# Patient Record
Sex: Female | Born: 1975 | Race: White | Hispanic: No | Marital: Married | Smoking: Former smoker
Health system: Southern US, Community
[De-identification: ages and names within clinical notes are randomized; demographics above are authoritative.]

## PROBLEM LIST (undated history)

## (undated) ENCOUNTER — Inpatient Hospital Stay (HOSPITAL_COMMUNITY): Payer: Self-pay

## (undated) DIAGNOSIS — K589 Irritable bowel syndrome without diarrhea: Secondary | ICD-10-CM

## (undated) DIAGNOSIS — E785 Hyperlipidemia, unspecified: Secondary | ICD-10-CM

## (undated) DIAGNOSIS — T7840XA Allergy, unspecified, initial encounter: Secondary | ICD-10-CM

## (undated) DIAGNOSIS — Z789 Other specified health status: Secondary | ICD-10-CM

## (undated) DIAGNOSIS — Z8619 Personal history of other infectious and parasitic diseases: Secondary | ICD-10-CM

## (undated) DIAGNOSIS — F329 Major depressive disorder, single episode, unspecified: Secondary | ICD-10-CM

## (undated) DIAGNOSIS — K219 Gastro-esophageal reflux disease without esophagitis: Secondary | ICD-10-CM

## (undated) DIAGNOSIS — O09529 Supervision of elderly multigravida, unspecified trimester: Secondary | ICD-10-CM

## (undated) DIAGNOSIS — D649 Anemia, unspecified: Secondary | ICD-10-CM

## (undated) DIAGNOSIS — F32A Depression, unspecified: Secondary | ICD-10-CM

## (undated) HISTORY — PX: COLONOSCOPY: SHX174

## (undated) HISTORY — DX: Allergy, unspecified, initial encounter: T78.40XA

## (undated) HISTORY — DX: Hyperlipidemia, unspecified: E78.5

## (undated) HISTORY — DX: Gastro-esophageal reflux disease without esophagitis: K21.9

## (undated) HISTORY — DX: Irritable bowel syndrome, unspecified: K58.9

## (undated) HISTORY — PX: DILATION AND CURETTAGE OF UTERUS: SHX78

## (undated) HISTORY — DX: Supervision of elderly multigravida, unspecified trimester: O09.529

## (undated) HISTORY — PX: POLYPECTOMY: SHX149

## (undated) HISTORY — DX: Personal history of other infectious and parasitic diseases: Z86.19

## (undated) HISTORY — DX: Depression, unspecified: F32.A

## (undated) HISTORY — PX: WISDOM TOOTH EXTRACTION: SHX21

## (undated) HISTORY — DX: Anemia, unspecified: D64.9

## (undated) HISTORY — DX: Major depressive disorder, single episode, unspecified: F32.9

---

## 2001-09-21 HISTORY — PX: PILONIDAL CYST EXCISION: SHX744

## 2001-12-05 ENCOUNTER — Other Ambulatory Visit: Admission: RE | Admit: 2001-12-05 | Discharge: 2001-12-05 | Payer: Self-pay | Admitting: Obstetrics and Gynecology

## 2001-12-05 ENCOUNTER — Other Ambulatory Visit: Admission: RE | Admit: 2001-12-05 | Discharge: 2001-12-05 | Payer: Self-pay | Admitting: *Deleted

## 2002-12-18 ENCOUNTER — Other Ambulatory Visit: Admission: RE | Admit: 2002-12-18 | Discharge: 2002-12-18 | Payer: Self-pay | Admitting: *Deleted

## 2004-01-02 ENCOUNTER — Other Ambulatory Visit: Admission: RE | Admit: 2004-01-02 | Discharge: 2004-01-02 | Payer: Self-pay | Admitting: *Deleted

## 2008-10-19 ENCOUNTER — Ambulatory Visit (HOSPITAL_COMMUNITY): Admission: RE | Admit: 2008-10-19 | Discharge: 2008-10-19 | Payer: Self-pay | Admitting: Obstetrics & Gynecology

## 2008-10-19 ENCOUNTER — Encounter (INDEPENDENT_AMBULATORY_CARE_PROVIDER_SITE_OTHER): Payer: Self-pay | Admitting: Obstetrics & Gynecology

## 2009-01-11 ENCOUNTER — Ambulatory Visit: Payer: Self-pay | Admitting: Internal Medicine

## 2009-03-26 ENCOUNTER — Ambulatory Visit: Payer: Self-pay | Admitting: Internal Medicine

## 2009-05-10 ENCOUNTER — Encounter (INDEPENDENT_AMBULATORY_CARE_PROVIDER_SITE_OTHER): Payer: Self-pay | Admitting: Obstetrics & Gynecology

## 2009-05-10 ENCOUNTER — Ambulatory Visit (HOSPITAL_COMMUNITY): Admission: RE | Admit: 2009-05-10 | Discharge: 2009-05-10 | Payer: Self-pay | Admitting: Obstetrics & Gynecology

## 2009-11-07 ENCOUNTER — Ambulatory Visit: Payer: Self-pay | Admitting: Internal Medicine

## 2010-04-23 ENCOUNTER — Ambulatory Visit (HOSPITAL_COMMUNITY): Admission: RE | Admit: 2010-04-23 | Discharge: 2010-04-23 | Payer: Self-pay | Admitting: Obstetrics and Gynecology

## 2010-08-19 ENCOUNTER — Inpatient Hospital Stay (HOSPITAL_COMMUNITY)
Admission: AD | Admit: 2010-08-19 | Discharge: 2010-08-19 | Payer: Self-pay | Source: Home / Self Care | Admitting: Obstetrics and Gynecology

## 2010-08-24 ENCOUNTER — Inpatient Hospital Stay (HOSPITAL_COMMUNITY)
Admission: AD | Admit: 2010-08-24 | Discharge: 2010-08-24 | Payer: Self-pay | Source: Home / Self Care | Admitting: Obstetrics & Gynecology

## 2010-08-26 ENCOUNTER — Inpatient Hospital Stay (HOSPITAL_COMMUNITY)
Admission: AD | Admit: 2010-08-26 | Discharge: 2010-08-28 | Payer: Self-pay | Source: Home / Self Care | Attending: Obstetrics and Gynecology | Admitting: Obstetrics and Gynecology

## 2010-12-02 LAB — CBC
HCT: 30.6 % — ABNORMAL LOW (ref 36.0–46.0)
Hemoglobin: 10.7 g/dL — ABNORMAL LOW (ref 12.0–15.0)
Platelets: 199 10*3/uL (ref 150–400)
RBC: 3.16 MIL/uL — ABNORMAL LOW (ref 3.87–5.11)
RBC: 4.02 MIL/uL (ref 3.87–5.11)
RDW: 13.2 % (ref 11.5–15.5)

## 2010-12-27 LAB — CBC
Hemoglobin: 13.2 g/dL (ref 12.0–15.0)
MCHC: 34.3 g/dL (ref 30.0–36.0)
MCV: 95.6 fL (ref 78.0–100.0)
RBC: 4.03 MIL/uL (ref 3.87–5.11)
RDW: 12.6 % (ref 11.5–15.5)

## 2010-12-27 LAB — ABO/RH: ABO/RH(D): A POS

## 2011-01-01 ENCOUNTER — Other Ambulatory Visit: Payer: Self-pay | Admitting: Obstetrics and Gynecology

## 2011-01-05 LAB — CBC
Hemoglobin: 13.1 g/dL (ref 12.0–15.0)
MCV: 95 fL (ref 78.0–100.0)
RDW: 12.6 % (ref 11.5–15.5)
WBC: 9.1 10*3/uL (ref 4.0–10.5)

## 2011-02-03 NOTE — Op Note (Signed)
[name], Dawn Mcmillan            ACCOUNT NO.:  [id number]   MEDICAL RECORD NO.:  [id number]          PATIENT TYPE:  AMB   LOCATION:  SDC                           FACILITY:  [facility]   PHYSICIAN:  Genia Del, M.D.DATE OF BIRTH:  25-Sep-1975   DATE OF PROCEDURE:  05/10/2009  DATE OF DISCHARGE:                               OPERATIVE REPORT   PREOPERATIVE DIAGNOSIS:  Missed abortion at 6-plus weeks.   POSTOPERATIVE DIAGNOSIS:  Missed abortion at 6-plus weeks.   PROCEDURE:  Dilatation and evacuation.   SURGEON:  Genia Del, MD   ASSISTANT:  None.   PROCEDURE:  Under MAC analgesia, the patient is in lithotomy position,  she was prepped with Betadine on the suprapubic vulvar and vaginal areas  and draped as usual.  The vaginal exam reveals a retroverted uterus  about 7 cm, mobile, no adnexal mass.  The cervix was long and closed, no  vaginal bleeding.  We inserted the speculum in the vagina and a  paracervical block was done with Nesacaine 1%, a total of 20 mL.  The  anterior lip of the cervix was grasped with a tenaculum.  The cervix was  dilated with Hegar dilators up to #31 without difficulty.  We then used  an #8 curved-suction curette.  This was introduced in the intrauterine  cavity easily.  Suction of the intrauterine contents and products of  conception were sent to Pathology.  We then used a sharp curette to  assure complete evacuation of the products of conception.  Hemostasis  was adequate.  All instruments were removed.  The estimated blood loss  was minimal.  The patient received 1 g of Ancef IV at the beginning of  the procedure.  The patient was brought to recovery room in good stable  status.      Genia Del, M.D.  Electronically Signed     ML/MEDQ  D:  05/10/2009  T:  05/10/2009  Job:  [id number]

## 2011-02-03 NOTE — Op Note (Signed)
Dawn Mcmillan, Dawn Mcmillan            ACCOUNT NO.:  [id number]   MEDICAL RECORD NO.:  [id number]          PATIENT TYPE:  AMB   LOCATION:  SDC                           FACILITY:  [facility]   PHYSICIAN:  Genia Del, M.D.DATE OF BIRTH:  Feb 05, 1976   DATE OF PROCEDURE:  DATE OF DISCHARGE:                               OPERATIVE REPORT   PREOPERATIVE DIAGNOSIS:  Missed abortion, first trimester.   POSTOPERATIVE DIAGNOSIS:  Missed abortion, first trimester.   PROCEDURE:  Dilatation and evacuation.   SURGEON:  Genia Del, MD   No assistant.   PROCEDURE:  Under MAC analgesia, the patient was in lithotomy position.  She was prepped with Betadine on the suprapubic, vulvar, and vaginal  areas.  The bladder was catheterized.  We then draped the patient as  usual.  A dose of Ancef 1 g IV was given before induction.  We did a  vaginal exam revealing a retroverted uterus about 8 cm, mobile, no  adnexal mass.  The cervix was long and closed.  No bleeding was present.  The speculum was introduced in the vagina.  The anterior lip of the  cervix was grasped with a tenaculum.  A paracervical block was done with  lidocaine 1%, total of 20 mL at 4 and 8 o'clock.  We then dilated the  cervix with Hegar dilators up to #31 without difficulty and used a #9  curved suction curette.  The suction curette was introduced in the  intrauterine cavity.  Products of conception are suctioned and sent to  Pathology.  We then used a sharp curette to gently curettage all  intrauterine surfaces and go back once more with the suction curette to  assure that all products of conception and blood clots were removed.  The uterus contracted well on the instrument.  The suction curette was  removed.  The tenaculum was removed from the anterior lip of the cervix.  Silver nitrate was used to control hemostasis at that level.  We then  removed the speculum.  A vaginal exam was done.  The uterus was well  contracted.   Hemostasis was adequate.  The estimated blood loss was 50  mL.  No complications occurred, and the patient was brought to recovery  room in good stable status.      Genia Del, M.D.  Electronically Signed     ML/MEDQ  D:  10/19/2008  T:  10/20/2008  Job:  [id number]

## 2011-03-16 ENCOUNTER — Encounter: Payer: Self-pay | Admitting: Internal Medicine

## 2011-03-16 ENCOUNTER — Ambulatory Visit (INDEPENDENT_AMBULATORY_CARE_PROVIDER_SITE_OTHER): Payer: Managed Care, Other (non HMO) | Admitting: Internal Medicine

## 2011-03-16 VITALS — BP 108/62 | HR 64 | Ht 68.0 in | Wt 144.0 lb

## 2011-03-16 DIAGNOSIS — M65839 Other synovitis and tenosynovitis, unspecified forearm: Secondary | ICD-10-CM

## 2011-03-16 DIAGNOSIS — Z13220 Encounter for screening for lipoid disorders: Secondary | ICD-10-CM

## 2011-03-16 DIAGNOSIS — M778 Other enthesopathies, not elsewhere classified: Secondary | ICD-10-CM

## 2011-03-16 NOTE — Progress Notes (Signed)
  Subjective:    Patient ID: Dawn Mcmillan, female    DOB: November 05, 1975, 35 y.o.   MRN: [id number]  HPI patient last seen here February 2011. At that time she had otitis media and a viral syndrome. Patient says she had 3 miscarriages before having a successful pregnancy. Pecola Leisure is now several months old and doing well. She's been picking up her baby quite a bit. On Friday she noticed pain in her right arm and forearm. A day or 2 ago, she noted a "lump" right proximal forearm. She was worried about a blood clot. Some tenderness on palpation of this "lump ". Good range of motion at elbow  and shoulder. No fever or chills. No injury that she is aware of. Says she bruises easily.    Review of Systems     Objective:   Physical Exam small ill-defined thickening proximal forearm medial aspect. About the size of a nickel. Tenderness to direct palpation of this area. No bruising noted. Full range of motion at elbow no and shoulder.        Assessment & Plan:  Suspect tendinitis left forearm. Recommend using ice 20 minutes twice daily. May take and they'll or Aleve for pain if needed. Call if not better in 2 weeks sooner if worse. Patient wants to have fasting lipid panel in the near future and will come in later this week for that.

## 2011-03-16 NOTE — Patient Instructions (Signed)
Use ice on 4 arm for 20 minutes twice daily. May take Aleve or Advil for pain and right arm if necessary. Call if not better in 2 weeks or sooner if worse

## 2011-03-17 ENCOUNTER — Other Ambulatory Visit: Payer: Managed Care, Other (non HMO) | Admitting: Internal Medicine

## 2011-03-17 DIAGNOSIS — Z0000 Encounter for general adult medical examination without abnormal findings: Secondary | ICD-10-CM

## 2011-03-17 LAB — LIPID PANEL
HDL: 50 mg/dL (ref >39)
Total CHOL/HDL Ratio: 4.2 Ratio

## 2011-03-18 ENCOUNTER — Encounter: Payer: Self-pay | Admitting: Internal Medicine

## 2011-12-08 ENCOUNTER — Ambulatory Visit (INDEPENDENT_AMBULATORY_CARE_PROVIDER_SITE_OTHER): Payer: Managed Care, Other (non HMO) | Admitting: Internal Medicine

## 2011-12-08 ENCOUNTER — Encounter: Payer: Self-pay | Admitting: Internal Medicine

## 2011-12-08 VITALS — BP 104/76 | HR 76 | Temp 98.8°F | Ht 68.0 in | Wt 146.0 lb

## 2011-12-08 DIAGNOSIS — H1033 Unspecified acute conjunctivitis, bilateral: Secondary | ICD-10-CM

## 2011-12-08 DIAGNOSIS — J069 Acute upper respiratory infection, unspecified: Secondary | ICD-10-CM

## 2011-12-08 DIAGNOSIS — H1030 Unspecified acute conjunctivitis, unspecified eye: Secondary | ICD-10-CM

## 2011-12-08 NOTE — Patient Instructions (Signed)
Take 

## 2011-12-08 NOTE — Progress Notes (Signed)
  Subjective:    Patient ID: Dawn Mcmillan, female    DOB: 03-Nov-1975, 36 y.o.   MRN: [id number]  HPI 36 year old white female not seen since February 2011. Has had 6 day history of URI symptoms and laryngitis. Has had discolored sputum production. No fever or chills. Yesterday developed bilateral conjunctivitis. Has had drainage from both eyes particularly left eye which was nearly shut yesterday. Some pain in eyes.    Review of Systems     Objective:   Physical Exam bilateral conjunctivitis with drainage and corners of eyes. Extraocular movements are full. PERRLA a. Left TM is full but not red. Right TM is normal. Neck is supple without adenopathy. Chest clear.        Assessment & Plan:  Upper respiratory infection  Bilateral conjunctivitis  Plan: Polysporin ophthalmic drops 2 drops in each eye 4 times a day for 5-7 days. Augmentin 500 mg 3 times daily for 10 days. Take with food. Last menstrual period was approximately February 27

## 2012-02-26 ENCOUNTER — Inpatient Hospital Stay (HOSPITAL_COMMUNITY)
Admission: AD | Admit: 2012-02-26 | Discharge: 2012-02-26 | Disposition: A | Discharge status: Home / Self Care | Payer: Managed Care, Other (non HMO) | Source: Ambulatory Visit | Attending: Obstetrics and Gynecology | Admitting: Obstetrics and Gynecology

## 2012-02-26 ENCOUNTER — Encounter (HOSPITAL_COMMUNITY): Payer: Self-pay

## 2012-02-26 DIAGNOSIS — O210 Mild hyperemesis gravidarum: Secondary | ICD-10-CM | POA: Insufficient documentation

## 2012-02-26 DIAGNOSIS — O219 Vomiting of pregnancy, unspecified: Secondary | ICD-10-CM

## 2012-02-26 HISTORY — DX: Other specified health status: Z78.9

## 2012-02-26 LAB — URINALYSIS, ROUTINE W REFLEX MICROSCOPIC
Protein, ur: 30 mg/dL — AB
Specific Gravity, Urine: >1.03 — ABNORMAL HIGH (ref 1.005–1.030)
Urobilinogen, UA: 0.2 mg/dL (ref 0.0–1.0)

## 2012-02-26 LAB — COMPREHENSIVE METABOLIC PANEL
Albumin: 3.5 g/dL (ref 3.5–5.2)
Alkaline Phosphatase: 55 U/L (ref 39–117)
BUN: 6 mg/dL (ref 6–23)
CO2: 26 mEq/L (ref 19–32)
Calcium: 8.9 mg/dL (ref 8.4–10.5)
Chloride: 98 mEq/L (ref 96–112)
GFR calc Af Amer: >90 mL/min (ref >90)
Glucose, Bld: 84 mg/dL (ref 70–99)
Total Bilirubin: 0.2 mg/dL — ABNORMAL LOW (ref 0.3–1.2)
Total Protein: 6.9 g/dL (ref 6.0–8.3)

## 2012-02-26 LAB — URINE MICROSCOPIC-ADD ON

## 2012-02-26 MED ORDER — PROMETHAZINE HCL 12.5 MG PO TABS: 25.0000 mg | ORAL_TABLET | Freq: Three times a day (TID) | ORAL | Status: DC | PRN

## 2012-02-26 MED ORDER — PROMETHAZINE HCL 25 MG/ML IJ SOLN
25.0000 mg | Freq: Once | INTRAVENOUS | Status: AC
  Administered 2012-02-26: 25 mg via INTRAVENOUS
  Filled 2012-02-26: qty 1

## 2012-02-26 NOTE — Discharge Instructions (Signed)
Morning Sickness Morning sickness is when you feel sick to your stomach (nauseous) during pregnancy. You may feel sick to your stomach and throw up (vomit). You may feel sick in the morning, but you can feel this way any time of day. Some women feel very sick to their stomach and cannot stop throwing up (hyperemesis gravidarum). HOME CARE  Take multivitamins as told by your doctor. Taking multivitamins before getting pregnant can stop or lessen the harshness of morning sickness.   Eat dry toast or unsalted crackers before getting out of bed.   Eat 5 to 6 small meals a day.   Eat dry and bland foods like rice and baked potatoes.   Do not drink liquids with meals. Drink between meals.   Do not eat greasy, fatty, or spicy foods.   Have someone cook for you if the smell of food causes you to feel sick or throw up.   Do not take vitamins with iron, or as told by your doctor.   Eat protein when you need a snack (nuts, yogurt, cheese).   Eat unsweetened gelatins for dessert.   Wear a bracelet used for sea sickness (acupressure wristband).   Go to a doctor that puts thin needles into certain body points (acupuncture) to improve how you feel.   Do not smoke.   Use a humidifier to keep the air in your house free of odors.  GET HELP RIGHT AWAY IF:   You feel very sick to your stomach and cannot stop throwing up.   You pass out (faint).   You have a fever.   You need medicine to feel better.   You feel dizzy or lightheaded.   You are losing weight.   You need help knowing what to eat and what not to eat.  MAKE SURE YOU:   Understand these instructions.   Will watch your condition.   Will get help right away if you are not doing well or get worse.  Document Released: 10/15/2004 Document Revised: 08/27/2011 Document Reviewed: 12/05/2009 [employer] Patient Information 2012 [employer]. 

## 2012-02-26 NOTE — MAU Provider Note (Signed)
History     CSN: [id number]  Arrival date and time: 02/26/12 4098   First Provider Initiated Contact with Patient 02/26/12 0940      Chief Complaint  Patient presents with  . Diarrhea  . Emesis During Pregnancy   HPI Dawn Mcmillan is 36 y.o. [id number] [redacted]w[redacted]d weeks presenting with 3 weeks of nausea and vomiting.  Given a Rx for Zofran 4 days ago by Dr. Dareen Piano.  Began 24 hours ago, has had 9-10 loose stools.  She called the office and they instructed her to come here.  Denies fever or chills.  Husband not sick.    Past Medical History  Diagnosis Date  . No pertinent past medical history     Past Surgical History  Procedure Date  . Pilonidal cyst excision 2003  . Dilation and curettage of uterus     x 2  . Wisdom tooth extraction     Family History  Problem Relation Age of Onset  . Arthritis Mother   . Heart disease Father     History  Substance Use Topics  . Smoking status: Former Smoker    Quit date: 03/15/2002  . Smokeless tobacco: Not on file  . Alcohol Use: No     occaisionally     Allergies: No Known Allergies  Prescriptions prior to admission  Medication Sig Dispense Refill  . ondansetron (ZOFRAN) 8 MG tablet Take by mouth every 8 (eight) hours as needed. nausea      . Prenatal Vit-Fe Fumarate-FA (PRENATAL MULTIVITAMIN) TABS Take 1 tablet by mouth daily.      . progesterone (PROMETRIUM) 200 MG capsule Take 200 mg by mouth daily.        Review of Systems  Constitutional: Negative for fever and chills.  Respiratory: Negative.   Gastrointestinal: Positive for nausea, vomiting and abdominal pain.  Genitourinary:       Negative for vaginal bleeding or discharge   Physical Exam   Blood pressure 106/74, pulse 86, temperature 98.1 F (36.7 C), temperature source Oral, resp. rate 16, height 5' 7.5" (1.715 m), weight 64.048 kg (141 lb 3.2 oz), last menstrual period 11/17/2011, SpO2 100.00%.  Physical Exam  Constitutional: She is oriented to person,  place, and time. She appears well-developed and well-nourished. No distress.  HENT:  Head: Normocephalic.  Respiratory: Effort normal.  Genitourinary:       Not indicated  Neurological: She is alert and oriented to person, place, and time.  Skin: Skin is warm and dry.  Psychiatric: She has a normal mood and affect. Her behavior is normal. Thought content normal.   Results for orders placed during the hospital encounter of 02/26/12 (from the past 24 hour(s))  URINALYSIS, ROUTINE W REFLEX MICROSCOPIC     Status: Abnormal   Collection Time   02/26/12  9:10 AM      Component Value Range   Color, Urine YELLOW  YELLOW    APPearance CLEAR  CLEAR    Specific Gravity, Urine >1.030 (*) 1.005 - 1.030    pH 6.0  5.0 - 8.0    Glucose, UA NEGATIVE  NEGATIVE (mg/dL)   Hgb urine dipstick NEGATIVE  NEGATIVE    Bilirubin Urine SMALL (*) NEGATIVE    Ketones, ur NEGATIVE  NEGATIVE (mg/dL)   Protein, ur 30 (*) NEGATIVE (mg/dL)   Urobilinogen, UA 0.2  0.0 - 1.0 (mg/dL)   Nitrite NEGATIVE  NEGATIVE    Leukocytes, UA TRACE (*) NEGATIVE   URINE MICROSCOPIC-ADD ON  Status: Abnormal   Collection Time   02/26/12  9:10 AM      Component Value Range   Squamous Epithelial / LPF FEW (*) RARE    WBC, UA 3-6  <3 (WBC/hpf)   RBC / HPF 0-2  <3 (RBC/hpf)   Bacteria, UA FEW (*) RARE    Urine-Other MUCOUS PRESENT    COMPREHENSIVE METABOLIC PANEL     Status: Abnormal   Collection Time   02/26/12 10:20 AM      Component Value Range   Sodium 131 (*) 135 - 145 (mEq/L)   Potassium 3.6  3.5 - 5.1 (mEq/L)   Chloride 98  96 - 112 (mEq/L)   CO2 26  19 - 32 (mEq/L)   Glucose, Bld 84  70 - 99 (mg/dL)   BUN 6  6 - 23 (mg/dL)   Creatinine, Ser 1.30  0.50 - 1.10 (mg/dL)   Calcium 8.9  8.4 - 86.5 (mg/dL)   Total Protein 6.9  6.0 - 8.3 (g/dL)   Albumin 3.5  3.5 - 5.2 (g/dL)   AST 12  0 - 37 (U/L)   ALT 11  0 - 35 (U/L)   Alkaline Phosphatase 55  39 - 117 (U/L)   Total Bilirubin 0.2 (*) 0.3 - 1.2 (mg/dL)   GFR calc non  Af Amer >90  >90 (mL/min)   GFR calc Af Amer >90  >90 (mL/min)    MAU Course  Procedures  MDM 10:18  Reported MSE and lab results to Dr. Henderson Cloud.  Order given for CMet, 1 liter of LR with Phenergan and may discharge with Rx for phenergan. 13:07  Reported to Dr. Henderson Cloud that she is feeling much better. Order given to discharge with Phenergan to take when Zofran not holding sxs. Assessment and Plan  A:  Nausea and vomiting in first trimester pregnancy  P:  Rx for Phenergan to use when Zofran not helpful.       Keep scheduled appointment  Marquisa Salih,EVE M 02/26/2012, 9:44 AM

## 2012-02-26 NOTE — MAU Note (Signed)
Patient state she has had nausea and vomiting for about 3 weeks, getting worse and having diarrhea for the past 24 hours. Denies any pain.

## 2012-03-15 LAB — OB RESULTS CONSOLE GC/CHLAMYDIA
Chlamydia: NEGATIVE
Gonorrhea: NEGATIVE

## 2012-03-15 LAB — OB RESULTS CONSOLE ABO/RH: RH Type: POSITIVE

## 2012-03-15 LAB — OB RESULTS CONSOLE HIV ANTIBODY (ROUTINE TESTING): HIV: NONREACTIVE

## 2012-03-15 LAB — OB RESULTS CONSOLE HEPATITIS B SURFACE ANTIGEN: Hepatitis B Surface Ag: NEGATIVE

## 2012-08-30 LAB — OB RESULTS CONSOLE GBS: GBS: NEGATIVE

## 2012-09-21 NOTE — L&D Delivery Note (Signed)
Pt completed the first stage without difficulty. During the second stage the baby developed variable decels. She was checked and found to be OP. The baby was manually rotated to OA.  The VE was placed at +3 station in the ROA position to shorten the second stage. She delivered one live  Viable white female over a second degree midline tear. There was one pop off. Placenta S/I. EBL-400cc. Baby to NBN. Tear closed with 3-0 chromic.

## 2012-09-23 ENCOUNTER — Encounter (HOSPITAL_COMMUNITY): Payer: Self-pay | Admitting: *Deleted

## 2012-09-23 ENCOUNTER — Telehealth (HOSPITAL_COMMUNITY): Payer: Self-pay | Admitting: *Deleted

## 2012-09-23 NOTE — Telephone Encounter (Signed)
Preadmission screen  

## 2012-09-27 ENCOUNTER — Inpatient Hospital Stay (HOSPITAL_COMMUNITY)
Admission: RE | Admit: 2012-09-27 | Discharge: 2012-09-29 | DRG: 373 | Disposition: A | Discharge status: Home / Self Care | Payer: BC Managed Care – PPO | Source: Ambulatory Visit | Attending: Obstetrics and Gynecology | Admitting: Obstetrics and Gynecology

## 2012-09-27 ENCOUNTER — Encounter (HOSPITAL_COMMUNITY): Payer: Self-pay

## 2012-09-27 LAB — CBC
HCT: 38.5 % (ref 36.0–46.0)
MCHC: 34.8 g/dL (ref 30.0–36.0)
MCV: 92.1 fL (ref 78.0–100.0)
Platelets: 167 10*3/uL (ref 150–400)
RDW: 13.7 % (ref 11.5–15.5)

## 2012-09-27 MED ORDER — IBUPROFEN 600 MG PO TABS: 600.0000 mg | ORAL_TABLET | Freq: Four times a day (QID) | ORAL | Status: DC | PRN

## 2012-09-27 MED ORDER — OXYTOCIN BOLUS FROM INFUSION
500.0000 mL | INTRAVENOUS | Status: DC
  Administered 2012-09-28: 500 mL via INTRAVENOUS

## 2012-09-27 MED ORDER — OXYCODONE-ACETAMINOPHEN 5-325 MG PO TABS: 1.0000 | ORAL_TABLET | ORAL | Status: DC | PRN

## 2012-09-27 MED ORDER — ACETAMINOPHEN 325 MG PO TABS: 650.0000 mg | ORAL_TABLET | ORAL | Status: DC | PRN

## 2012-09-27 MED ORDER — OXYTOCIN 40 UNITS IN LACTATED RINGERS INFUSION - SIMPLE MED: 1.0000 m[IU]/min | INTRAVENOUS | Status: DC

## 2012-09-27 MED ORDER — FLEET ENEMA 7-19 GM/118ML RE ENEM: 1.0000 | ENEMA | RECTAL | Status: DC | PRN

## 2012-09-27 MED ORDER — OXYTOCIN 40 UNITS IN LACTATED RINGERS INFUSION - SIMPLE MED
62.5000 mL/h | INTRAVENOUS | Status: DC
  Administered 2012-09-28: 62.5 mL/h via INTRAVENOUS
  Filled 2012-09-27: qty 1000

## 2012-09-27 MED ORDER — ZOLPIDEM TARTRATE 5 MG PO TABS
5.0000 mg | ORAL_TABLET | Freq: Every evening | ORAL | Status: DC | PRN
  Administered 2012-09-27: 5 mg via ORAL
  Filled 2012-09-27: qty 1

## 2012-09-27 MED ORDER — MISOPROSTOL 25 MCG QUARTER TABLET
25.0000 ug | ORAL_TABLET | ORAL | Status: DC | PRN
  Administered 2012-09-27 – 2012-09-28 (×2): 25 ug via VAGINAL
  Filled 2012-09-27: qty 0.25
  Filled 2012-09-27: qty 1
  Filled 2012-09-27: qty 0.25

## 2012-09-27 MED ORDER — LACTATED RINGERS IV SOLN
INTRAVENOUS | Status: DC
  Administered 2012-09-27 – 2012-09-28 (×2): via INTRAVENOUS

## 2012-09-27 MED ORDER — LIDOCAINE HCL (PF) 1 % IJ SOLN
30.0000 mL | INTRAMUSCULAR | Status: DC | PRN
  Filled 2012-09-27 (×2): qty 30

## 2012-09-27 MED ORDER — BUTORPHANOL TARTRATE 2 MG/ML IJ SOLN
2.0000 mg | INTRAMUSCULAR | Status: DC | PRN
  Administered 2012-09-28: 2 mg via INTRAVENOUS
  Filled 2012-09-27: qty 2

## 2012-09-27 MED ORDER — TERBUTALINE SULFATE 1 MG/ML IJ SOLN: 0.2500 mg | Freq: Once | INTRAMUSCULAR | Status: AC | PRN

## 2012-09-27 MED ORDER — CITRIC ACID-SODIUM CITRATE 334-500 MG/5ML PO SOLN: 30.0000 mL | ORAL | Status: DC | PRN

## 2012-09-27 MED ORDER — LACTATED RINGERS IV SOLN: 500.0000 mL | INTRAVENOUS | Status: DC | PRN

## 2012-09-27 MED ORDER — ONDANSETRON HCL 4 MG/2ML IJ SOLN: 4.0000 mg | Freq: Four times a day (QID) | INTRAMUSCULAR | Status: DC | PRN

## 2012-09-27 NOTE — H&P (Signed)
37 y.o. [id number]  Estimated Date of Delivery: 09/29/12 admitted at 39/[redacted] weeks gestation for induction.  Prenatal Transfer Tool  Maternal Diabetes: No Genetic Screening: Normal (Materni 21) Maternal Ultrasounds/Referrals: Normal Fetal Ultrasounds or other Referrals:  None Maternal Substance Abuse:  No Significant Maternal Medications:  Meds include: Progesterone in first trimester for LPD. Significant Maternal Lab Results: None Other Significant Pregnancy Complications:  None  Afebrile, VSS Heart and Lungs: No active disease Abdomen: soft, gravid, EFW AGA. Cervical exam:  2/50  Impression: Term pregnancy, elective induction  Plan:  Cytotec/pitocin induction

## 2012-09-28 ENCOUNTER — Inpatient Hospital Stay (HOSPITAL_COMMUNITY): Admit: 2012-09-28 | Payer: Managed Care, Other (non HMO) | Admitting: Obstetrics and Gynecology

## 2012-09-28 ENCOUNTER — Inpatient Hospital Stay (HOSPITAL_COMMUNITY): Admission: RE | Admit: 2012-09-28 | Payer: Managed Care, Other (non HMO) | Source: Ambulatory Visit

## 2012-09-28 ENCOUNTER — Encounter (HOSPITAL_COMMUNITY): Payer: Self-pay | Admitting: Anesthesiology

## 2012-09-28 ENCOUNTER — Inpatient Hospital Stay (HOSPITAL_COMMUNITY): Payer: BC Managed Care – PPO | Admitting: Anesthesiology

## 2012-09-28 ENCOUNTER — Encounter (HOSPITAL_COMMUNITY): Payer: Self-pay

## 2012-09-28 MED ORDER — IBUPROFEN 600 MG PO TABS
600.0000 mg | ORAL_TABLET | Freq: Four times a day (QID) | ORAL | Status: DC
  Administered 2012-09-28 – 2012-09-29 (×5): 600 mg via ORAL
  Filled 2012-09-28 (×5): qty 1

## 2012-09-28 MED ORDER — OXYCODONE-ACETAMINOPHEN 5-325 MG PO TABS
1.0000 | ORAL_TABLET | ORAL | Status: DC | PRN
  Administered 2012-09-28 – 2012-09-29 (×3): 1 via ORAL
  Filled 2012-09-28 (×3): qty 1

## 2012-09-28 MED ORDER — MEASLES, MUMPS & RUBELLA VAC ~~LOC~~ INJ: 0.5000 mL | INJECTION | Freq: Once | SUBCUTANEOUS | Status: DC

## 2012-09-28 MED ORDER — FENTANYL 2.5 MCG/ML BUPIVACAINE 1/10 % EPIDURAL INFUSION (WH - ANES)
14.0000 mL/h | INTRAMUSCULAR | Status: DC
  Filled 2012-09-28: qty 125

## 2012-09-28 MED ORDER — BENZOCAINE-MENTHOL 20-0.5 % EX AERO
1.0000 "application " | INHALATION_SPRAY | CUTANEOUS | Status: DC | PRN
  Administered 2012-09-28: 1 via TOPICAL
  Filled 2012-09-28 (×2): qty 56

## 2012-09-28 MED ORDER — ONDANSETRON HCL 4 MG/2ML IJ SOLN: 4.0000 mg | INTRAMUSCULAR | Status: DC | PRN

## 2012-09-28 MED ORDER — PHENYLEPHRINE 40 MCG/ML (10ML) SYRINGE FOR IV PUSH (FOR BLOOD PRESSURE SUPPORT): 80.0000 ug | PREFILLED_SYRINGE | INTRAVENOUS | Status: DC | PRN

## 2012-09-28 MED ORDER — DIBUCAINE 1 % RE OINT: 1.0000 "application " | TOPICAL_OINTMENT | RECTAL | Status: DC | PRN

## 2012-09-28 MED ORDER — LACTATED RINGERS IV SOLN
500.0000 mL | Freq: Once | INTRAVENOUS | Status: AC
  Administered 2012-09-28: 05:00:00 via INTRAVENOUS

## 2012-09-28 MED ORDER — DIPHENHYDRAMINE HCL 50 MG/ML IJ SOLN: 12.5000 mg | INTRAMUSCULAR | Status: DC | PRN

## 2012-09-28 MED ORDER — LIDOCAINE HCL (PF) 1 % IJ SOLN
INTRAMUSCULAR | Status: DC | PRN
  Administered 2012-09-28 (×2): 9 mL

## 2012-09-28 MED ORDER — WITCH HAZEL-GLYCERIN EX PADS: 1.0000 "application " | MEDICATED_PAD | CUTANEOUS | Status: DC | PRN

## 2012-09-28 MED ORDER — EPHEDRINE 5 MG/ML INJ
10.0000 mg | INTRAVENOUS | Status: DC | PRN
  Filled 2012-09-28: qty 4

## 2012-09-28 MED ORDER — EPHEDRINE 5 MG/ML INJ: 10.0000 mg | INTRAVENOUS | Status: DC | PRN

## 2012-09-28 MED ORDER — SIMETHICONE 80 MG PO CHEW: 80.0000 mg | CHEWABLE_TABLET | ORAL | Status: DC | PRN

## 2012-09-28 MED ORDER — TETANUS-DIPHTH-ACELL PERTUSSIS 5-2.5-18.5 LF-MCG/0.5 IM SUSP
0.5000 mL | Freq: Once | INTRAMUSCULAR | Status: AC
  Administered 2012-09-29: 0.5 mL via INTRAMUSCULAR
  Filled 2012-09-28: qty 0.5

## 2012-09-28 MED ORDER — PHENYLEPHRINE 40 MCG/ML (10ML) SYRINGE FOR IV PUSH (FOR BLOOD PRESSURE SUPPORT)
80.0000 ug | PREFILLED_SYRINGE | INTRAVENOUS | Status: DC | PRN
  Filled 2012-09-28: qty 5

## 2012-09-28 MED ORDER — ZOLPIDEM TARTRATE 5 MG PO TABS: 5.0000 mg | ORAL_TABLET | Freq: Every evening | ORAL | Status: DC | PRN

## 2012-09-28 MED ORDER — FENTANYL 2.5 MCG/ML BUPIVACAINE 1/10 % EPIDURAL INFUSION (WH - ANES)
INTRAMUSCULAR | Status: DC | PRN
  Administered 2012-09-28: 14 mL/h via EPIDURAL

## 2012-09-28 MED ORDER — ONDANSETRON HCL 4 MG PO TABS: 4.0000 mg | ORAL_TABLET | ORAL | Status: DC | PRN

## 2012-09-28 NOTE — Anesthesia Preprocedure Evaluation (Signed)

## 2012-09-28 NOTE — Progress Notes (Signed)
Foley catheter removed

## 2012-09-28 NOTE — Progress Notes (Signed)
Using rope 

## 2012-09-28 NOTE — Anesthesia Procedure Notes (Signed)
Epidural Patient location during procedure: OB Start time: 09/28/2012 5:19 AM End time: 09/28/2012 5:24 AM  Staffing Anesthesiologist: Sandrea Hughs Performed by: anesthesiologist   Preanesthetic Checklist Completed: patient identified, site marked, surgical consent, pre-op evaluation, timeout performed, IV checked, risks and benefits discussed and monitors and equipment checked  Epidural Patient position: sitting Prep: site prepped and draped and DuraPrep Patient monitoring: continuous pulse ox and blood pressure Approach: midline Injection technique: LOR air  Needle:  Needle type: Tuohy  Needle gauge: 17 G Needle length: 9 cm and 9 Needle insertion depth: 7 cm Catheter type: closed end flexible Catheter size: 19 Gauge Catheter at skin depth: 12 cm Test dose: negative and Other  Assessment Sensory level: T8 Events: blood not aspirated, injection not painful, no injection resistance, negative IV test and no paresthesia  Additional Notes Reason for block:procedure for pain

## 2012-09-29 LAB — CBC
Hemoglobin: 11.7 g/dL — ABNORMAL LOW (ref 12.0–15.0)
MCH: 32 pg (ref 26.0–34.0)
MCHC: 34 g/dL (ref 30.0–36.0)
Platelets: 132 10*3/uL — ABNORMAL LOW (ref 150–400)

## 2012-09-29 MED ORDER — IBUPROFEN 600 MG PO TABS: 600.0000 mg | ORAL_TABLET | Freq: Four times a day (QID) | ORAL | Status: DC

## 2012-09-29 MED ORDER — OXYCODONE-ACETAMINOPHEN 5-325 MG PO TABS
1.0000 | ORAL_TABLET | ORAL | Status: DC | PRN
Start: 2012-09-29 — End: 2013-02-09

## 2012-09-29 NOTE — Discharge Summary (Signed)
Obstetric Discharge Summary Reason for Admission: induction of labor Prenatal Procedures: none Intrapartum Procedures: vacuum Postpartum Procedures: none Complications-Operative and Postpartum: 2nd degree perineal laceration Hemoglobin  Date Value Range Status  09/29/2012 11.7* 12.0 - 15.0 g/dL Final     HCT  Date Value Range Status  09/29/2012 34.4* 36.0 - 46.0 % Final    Physical Exam:  General: alert and cooperative Lochia: appropriate Uterine Fundus: firm DVT Evaluation: No evidence of DVT seen on physical exam.  Discharge Diagnoses: Term Pregnancy-delivered  Discharge Information: Date: 09/29/2012 Activity: pelvic rest Diet: routine Medications: PNV and Ibuprofen Condition: stable Instructions: refer to practice specific booklet Discharge to: home Follow-up Information    Follow up with Mickel Baas, MD. In 4 weeks.   Contact information:   [address] [place] [address] [phone]          Newborn Data: Live born female  Birth Weight: 7 lb 14.6 oz (3589 g) APGAR: 8, 9  Home with mother.  Philip Aspen 09/29/2012, 10:07 AM

## 2012-09-29 NOTE — Transfer of Care (Deleted)
Immediate Anesthesia Transfer of Care Note  Patient: Dawn Mcmillan  Procedure(s) Performed: * No procedures listed *  Patient Location: Mother/Baby  Anesthesia Type:Epidural  Level of Consciousness: awake, alert  and oriented  Airway & Oxygen Therapy: Patient Spontanous Breathing  Post-op Assessment: Report given to PACU RN and Post -op Vital signs reviewed and stable  Post vital signs: Reviewed and stable  Complications: No apparent anesthesia complications

## 2012-09-29 NOTE — Addendum Note (Signed)
Addendum  created 09/29/12 1638 by Gerrica Cygan M Jakarius Flamenco, CRNA   Modules edited:Notes Section    

## 2012-09-29 NOTE — Anesthesia Postprocedure Evaluation (Signed)
Anesthesia Post Note  Patient: Dawn Mcmillan  Procedure(s) Performed: * No procedures listed *  Anesthesia type: Epidural  Patient location: Mother/Baby  Post pain: Pain level controlled  Post assessment: Post-op Vital signs reviewed  Last Vitals: There were no vitals filed for this visit.  Post vital signs: Reviewed  Level of consciousness: awake  Complications: No apparent anesthesia complications

## 2012-09-29 NOTE — Addendum Note (Signed)
Addendum  created 09/29/12 1638 by Shanon Payor, CRNA   Modules edited:Notes Section

## 2012-09-29 NOTE — Anesthesia Postprocedure Evaluation (Signed)
Anesthesia Post Note  Patient: Dawn Mcmillan  Procedure(s) Performed: * No procedures listed *  Anesthesia type: Epidural  Patient location: Mother/Baby  Post pain: Pain level controlled  Post assessment: Post-op Vital signs reviewed  Last Vitals: There were no vitals filed for this visit.  Post vital signs: Reviewed  Level of consciousness: awake  Complications: No apparent anesthesia complications 

## 2013-02-09 ENCOUNTER — Ambulatory Visit (INDEPENDENT_AMBULATORY_CARE_PROVIDER_SITE_OTHER): Payer: BC Managed Care – PPO | Admitting: Internal Medicine

## 2013-02-09 ENCOUNTER — Encounter: Payer: Self-pay | Admitting: Internal Medicine

## 2013-02-09 ENCOUNTER — Other Ambulatory Visit (INDEPENDENT_AMBULATORY_CARE_PROVIDER_SITE_OTHER): Payer: BC Managed Care – PPO | Admitting: Internal Medicine

## 2013-02-09 VITALS — BP 106/80 | Temp 100.0°F | Wt 142.0 lb

## 2013-02-09 DIAGNOSIS — R509 Fever, unspecified: Secondary | ICD-10-CM

## 2013-02-09 DIAGNOSIS — J020 Streptococcal pharyngitis: Secondary | ICD-10-CM

## 2013-02-09 LAB — CBC WITH DIFFERENTIAL/PLATELET
HCT: 42.8 % (ref 36.0–46.0)
Lymphocytes Relative: 13 % (ref 12–46)
Lymphs Abs: 1.9 10*3/uL (ref 0.7–4.0)
MCV: 93 fL (ref 78.0–100.0)
Monocytes Absolute: 0.2 10*3/uL (ref 0.1–1.0)
Monocytes Relative: 2 % — ABNORMAL LOW (ref 3–12)
RBC: 4.6 MIL/uL (ref 3.87–5.11)
WBC: 14.3 10*3/uL — ABNORMAL HIGH (ref 4.0–10.5)

## 2013-02-09 NOTE — Progress Notes (Signed)
Patient informed. 

## 2013-02-14 NOTE — Patient Instructions (Addendum)
Take amoxicillin 500 mg 3 times daily for 10 days 

## 2013-02-14 NOTE — Progress Notes (Signed)
  Subjective:    Patient ID: Dawn Mcmillan, female    DOB: Jun 27, 1976, 37 y.o.   MRN: [id number]  HPI  Patient in today with sore throat, swollen lymph nodes, myalgias and chills. Has had fever. Has a headache. Onset of symptoms was 2 days ago. Says a few weeks ago her son was diagnosed with strep throat and was treated. He apparently is asymptomatic. She has a small baby she's breast-feeding at home who is not sick either. Husband is not sick.  Felt nauseated and vomited once this morning.    Review of Systems     Objective:   Physical Exam  Pharynx is red without exudate. Rapid strep screen is positive. TMs are slightly full but not read. Anterior cervical nodes bilaterally. Neck is supple. Chest is clear        Assessment & Plan:  Strep throat  Plan: Amoxicillin 500 mg 3 times daily for 10 days. Patient is breast-feeding. May want to have her son checked to see if he is a carrier.

## 2013-04-14 ENCOUNTER — Ambulatory Visit (INDEPENDENT_AMBULATORY_CARE_PROVIDER_SITE_OTHER): Payer: BC Managed Care – PPO | Admitting: Internal Medicine

## 2013-04-14 ENCOUNTER — Encounter: Payer: Self-pay | Admitting: Internal Medicine

## 2013-04-14 DIAGNOSIS — R42 Dizziness and giddiness: Secondary | ICD-10-CM

## 2013-04-14 DIAGNOSIS — H6590 Unspecified nonsuppurative otitis media, unspecified ear: Secondary | ICD-10-CM

## 2013-04-14 MED ORDER — AMOXICILLIN 500 MG PO CAPS: 500.0000 mg | ORAL_CAPSULE | Freq: Three times a day (TID) | ORAL | Status: DC

## 2013-04-14 NOTE — Patient Instructions (Addendum)
Await lab results. Take Amoxicillin 500 mg tid for ear infection. Call if symptoms do not improve

## 2013-04-15 LAB — COMPREHENSIVE METABOLIC PANEL
AST: 21 U/L (ref 0–37)
Albumin: 4.2 g/dL (ref 3.5–5.2)
BUN: 17 mg/dL (ref 6–23)
CO2: 28 mEq/L (ref 19–32)
Calcium: 9.4 mg/dL (ref 8.4–10.5)
Chloride: 105 mEq/L (ref 96–112)
Glucose, Bld: 66 mg/dL — ABNORMAL LOW (ref 70–99)
Potassium: 4.3 mEq/L (ref 3.5–5.3)

## 2013-05-23 ENCOUNTER — Other Ambulatory Visit: Payer: BC Managed Care – PPO | Admitting: Internal Medicine

## 2013-05-23 DIAGNOSIS — Z0000 Encounter for general adult medical examination without abnormal findings: Secondary | ICD-10-CM

## 2013-05-23 DIAGNOSIS — Z1329 Encounter for screening for other suspected endocrine disorder: Secondary | ICD-10-CM

## 2013-05-23 DIAGNOSIS — Z13220 Encounter for screening for lipoid disorders: Secondary | ICD-10-CM

## 2013-05-23 DIAGNOSIS — Z130 Encounter for screening for diseases of the blood and blood-forming organs and certain disorders involving the immune mechanism: Secondary | ICD-10-CM

## 2013-05-23 LAB — TSH: TSH: 0.944 u[IU]/mL (ref 0.350–4.500)

## 2013-05-23 LAB — CBC WITH DIFFERENTIAL/PLATELET
Basophils Relative: 0 % (ref 0–1)
Eosinophils Absolute: 0.1 10*3/uL (ref 0.0–0.7)
Lymphs Abs: 1.9 10*3/uL (ref 0.7–4.0)
MCH: 30.4 pg (ref 26.0–34.0)
Neutrophils Relative %: 54 % (ref 43–77)
Platelets: 213 10*3/uL (ref 150–400)
RBC: 4.47 MIL/uL (ref 3.87–5.11)

## 2013-05-23 LAB — LIPID PANEL
LDL Cholesterol: 139 mg/dL — ABNORMAL HIGH (ref 0–99)
VLDL: 12 mg/dL (ref 0–40)

## 2013-05-23 LAB — COMPREHENSIVE METABOLIC PANEL
ALT: 18 U/L (ref 0–35)
AST: 18 U/L (ref 0–37)
Alkaline Phosphatase: 64 U/L (ref 39–117)
CO2: 27 mEq/L (ref 19–32)
Sodium: 141 mEq/L (ref 135–145)
Total Bilirubin: 0.5 mg/dL (ref 0.3–1.2)
Total Protein: 6.8 g/dL (ref 6.0–8.3)

## 2013-05-24 LAB — VITAMIN D 25 HYDROXY (VIT D DEFICIENCY, FRACTURES): Vit D, 25-Hydroxy: 33 ng/mL (ref 30–89)

## 2013-05-25 ENCOUNTER — Encounter: Payer: Self-pay | Admitting: Internal Medicine

## 2013-05-25 ENCOUNTER — Ambulatory Visit (INDEPENDENT_AMBULATORY_CARE_PROVIDER_SITE_OTHER): Payer: BC Managed Care – PPO | Admitting: Internal Medicine

## 2013-05-25 VITALS — BP 94/66 | HR 72 | Ht 67.5 in | Wt 145.0 lb

## 2013-05-25 DIAGNOSIS — E7800 Pure hypercholesterolemia, unspecified: Secondary | ICD-10-CM

## 2013-05-25 DIAGNOSIS — Z23 Encounter for immunization: Secondary | ICD-10-CM

## 2013-05-25 DIAGNOSIS — Z0000 Encounter for general adult medical examination without abnormal findings: Secondary | ICD-10-CM

## 2013-05-25 LAB — POCT URINALYSIS DIPSTICK
Leukocytes, UA: NEGATIVE
Nitrite, UA: NEGATIVE
Protein, UA: NEGATIVE
pH, UA: 5.5

## 2013-05-25 NOTE — Progress Notes (Signed)
  Subjective:    Patient ID: Dawn Mcmillan, female    DOB: 07-20-1976, 37 y.o.   MRN: [id number]  HPI 37 year old white Female in today for health maintenance exam. Patient has gynecologist. She is breast-feeding an 52-month-old female who is a GI issues and will be seeing Dr. Chestine Spore soon. Not been able to tolerate solid food. Immunizations are up-to-date with the exception of influenza vaccine which she will take today. Was seen in was seen in July for otitis media and was treated with amoxicillin with improvement. She does take prenatal vitamins because she is breast-feeding.  Fasting lab work reviewed with her today. She has a moderately elevated LDL cholesterol. Does eat dairy products due to breast-feeding. Goes to [employer] for exercise. LDL cholesterol has improved from 2 years ago. Recommend diet and exercise.   Family history: Both parents with history of hyperlipidemia. Maternal grandfather with history of dementia. No direct maternal relatives with history of breast cancer although she is asking about breast cancer prevention today.  Patient says she has had 3 miscarriages before having as a successful pregnancy. 2 children.      Review of Systems  Constitutional: Negative.   All other systems reviewed and are negative.       Objective:   Physical Exam  Vitals reviewed. Constitutional: She is oriented to person, place, and time. She appears well-developed and well-nourished. No distress.  HENT:  Head: Normocephalic and atraumatic.  Right Ear: External ear normal.  Left Ear: External ear normal.  Mouth/Throat: Oropharynx is clear and moist. No oropharyngeal exudate.  Eyes: Conjunctivae and EOM are normal. Pupils are equal, round, and reactive to light. Right eye exhibits no discharge. Left eye exhibits no discharge. No scleral icterus.  Neck: Neck supple. No JVD present. No thyromegaly present.  Cardiovascular: Normal rate, regular rhythm, normal heart sounds and intact  distal pulses.   No murmur heard. Pulmonary/Chest: Effort normal and breath sounds normal. No respiratory distress. She has no wheezes. She has no rales. She exhibits no tenderness.  Abdominal: Soft. Bowel sounds are normal. She exhibits no distension and no mass. There is no tenderness. There is no rebound and no guarding.  Genitourinary:  Deferred to GYN  Musculoskeletal: Normal range of motion. She exhibits no edema.  Lymphadenopathy:    She has no cervical adenopathy.  Neurological: She is alert and oriented to person, place, and time. She has normal reflexes. No cranial nerve deficit. Coordination normal.  Skin: Skin is warm and dry. No rash noted. She is not diaphoretic.  Psychiatric: She has a normal mood and affect. Her behavior is normal. Judgment and thought content normal.          Assessment & Plan:  Normal health maintenance exam  Moderately elevated LDL cholesterol -recommend diet and exercise  Plan: Return in 2 years or as needed. Continue annual GYN exam. Influenza immunization given today.

## 2013-06-22 NOTE — Progress Notes (Signed)
  Subjective:    Patient ID: Dawn Mcmillan, female    DOB: July 20, 1976, 37 y.o.   MRN: [id number]  HPI Patient presents with complaint of lightheadedness and dizziness, shakiness, sleep deprivation and pain on inspiration x1. She is breast-feeding. Taking prenatal vitamins and Tylenol only. Was treated in May of this year for strep throat.  Patient admits to being under some stress. No significant headache, sore throat or URI symptoms. No fever or chills. Onset was rather sudden.    Review of Systems     Objective:   Physical Exam Skin is warm and dry. Nodes none. HEENT exam: PERRLA funduscopic exam is benign. Disc are sharp and flat bilaterally. Extraocular movements are full. TMs are full and slightly pink bilaterally. Pharynx is clear. Neck is supple without thyromegaly or adenopathy. Chest clear to auscultation without rales or wheezing. Cardiac exam regular rate and rhythm normal S1 and S2. Neurological exam: Cranial nerves II through XII grossly intact. Deep tendon reflexes 2+ and symmetrical. Muscle strength in all extremities normal. Cerebellar finger to nose testing normal. Gait is normal.        Assessment & Plan:  Unexplained lightheadedness and dizziness  Possible situational stress causing the symptoms  Bilateral serous otitis media  Plan: TSH, C- met, iron/ iron-binding capacity drawn. Amoxicillin 500 mg 3 times a day for 10 days.  Addendum: TSH is normal, iron / iron-binding capacity are normal. Glucose slightly low at 66 otherwise  comprehensive metabolic panel is normal. See if symptoms improve with antibiotic treatment.

## 2013-10-23 ENCOUNTER — Ambulatory Visit (INDEPENDENT_AMBULATORY_CARE_PROVIDER_SITE_OTHER): Payer: BC Managed Care – PPO | Admitting: Internal Medicine

## 2013-10-23 ENCOUNTER — Encounter: Payer: Self-pay | Admitting: Internal Medicine

## 2013-10-23 VITALS — BP 98/64 | HR 72 | Temp 99.1°F | Wt 146.0 lb

## 2013-10-23 DIAGNOSIS — A084 Viral intestinal infection, unspecified: Secondary | ICD-10-CM

## 2013-10-23 DIAGNOSIS — J069 Acute upper respiratory infection, unspecified: Secondary | ICD-10-CM

## 2013-10-23 DIAGNOSIS — A088 Other specified intestinal infections: Secondary | ICD-10-CM

## 2013-10-23 DIAGNOSIS — R109 Unspecified abdominal pain: Secondary | ICD-10-CM

## 2013-10-23 LAB — POCT URINALYSIS DIPSTICK
BILIRUBIN UA: NEGATIVE
Blood, UA: NEGATIVE
GLUCOSE UA: NEGATIVE
KETONES UA: NEGATIVE
LEUKOCYTES UA: NEGATIVE
Nitrite, UA: NEGATIVE
PROTEIN UA: NEGATIVE
Spec Grav, UA: 1.01
Urobilinogen, UA: NEGATIVE
pH, UA: 7.5

## 2013-10-23 LAB — HEMOCCULT GUIAC POC 1CARD (OFFICE): FECAL OCCULT BLD: NEGATIVE

## 2013-10-23 MED ORDER — BENZONATATE 200 MG PO CAPS: 200.0000 mg | ORAL_CAPSULE | Freq: Three times a day (TID) | ORAL | Status: DC | PRN

## 2013-10-23 MED ORDER — AZITHROMYCIN 250 MG PO TABS: ORAL_TABLET | ORAL | Status: DC

## 2013-10-23 NOTE — Addendum Note (Signed)
Addended by: Brett Canales on: 10/23/2013 05:28 PM   Modules accepted: Orders

## 2013-10-23 NOTE — Patient Instructions (Signed)
Try clear liquids and advance diet slowly for abdominal pain. Take Zithromax Z-Pak and Tessalon Perles for respiratory infection. Call if symptoms persist or worsen. Check with GYN regarding vaginal discharge

## 2013-10-23 NOTE — Progress Notes (Signed)
   Subjective:    Patient ID: Dawn Mcmillan, female    DOB: 05-29-1976, 38 y.o.   MRN: [id number]  HPI Patient came down with a flulike illness on January 2. She had chills, myalgias, headache and sore throat with fever up to 102. She went to urgent care and tested negative for influenza. Subsequently she got better within about 24 hours. However she still had some sinus pressure and congestion. Last week she flew to [place]. Had more issues with congestion cough and discolored nasal drainage. Prior departing for [place], she had some nausea and diarrhea. Now complaining of some right-sided abdominal pain. Has noted some dark stools. Does take prenatal vitamins. Continues to breast feed only at night. Has not had a menstrual period in about a year since delivering her child. Thought maybe she was getting ready to start her menstrual period and that was causing the abdominal pain. Worried about urinary tract infection.    Review of Systems     Objective:   Physical Exam TM slightly full bilaterally. Pharynx is clear. Neck is supple. Chest clear. Abdomen bowel sounds are active no hepatospleno megaly or masses. She has very mild tenderness just below the umbilicus to the right without rebound. Has mucus vaginal discharge without odor. Urinalysis is normal. Stool is guaiac negative.       Assessment & Plan:  Protracted respiratory infection  Abdominal pain-stool is negative for occult blood. Has had recent GI symptoms-probable more of iron as. Advise clear liquids and advance diet slowly. Call if symptoms persist or worsen. Urinalysis is normal.  Plan: Zithromax Z-Pak take as directed. Tessalon Perles 200 mg 3 times a day when necessary cough.

## 2013-11-12 NOTE — Patient Instructions (Signed)
Return in 1-2 years or as needed

## 2013-11-22 ENCOUNTER — Other Ambulatory Visit: Payer: Self-pay | Admitting: Obstetrics and Gynecology

## 2014-01-29 ENCOUNTER — Ambulatory Visit (INDEPENDENT_AMBULATORY_CARE_PROVIDER_SITE_OTHER): Payer: BC Managed Care – PPO | Admitting: Internal Medicine

## 2014-01-29 ENCOUNTER — Ambulatory Visit
Admission: RE | Admit: 2014-01-29 | Discharge: 2014-01-29 | Disposition: A | Discharge status: Home / Self Care | Payer: BC Managed Care – PPO | Source: Ambulatory Visit | Attending: Internal Medicine | Admitting: Internal Medicine

## 2014-01-29 ENCOUNTER — Encounter: Payer: Self-pay | Admitting: Internal Medicine

## 2014-01-29 VITALS — Temp 99.8°F | Wt 149.0 lb

## 2014-01-29 DIAGNOSIS — K5904 Chronic idiopathic constipation: Secondary | ICD-10-CM

## 2014-01-29 DIAGNOSIS — K5909 Other constipation: Secondary | ICD-10-CM

## 2014-01-29 DIAGNOSIS — R109 Unspecified abdominal pain: Secondary | ICD-10-CM

## 2014-01-29 LAB — POCT URINALYSIS DIPSTICK
Bilirubin, UA: NEGATIVE
Glucose, UA: NEGATIVE
KETONES UA: NEGATIVE
Leukocytes, UA: NEGATIVE
Nitrite, UA: NEGATIVE
PROTEIN UA: NEGATIVE
RBC UA: NEGATIVE
SPEC GRAV UA: 1.01
Urobilinogen, UA: NEGATIVE
pH, UA: 7.5

## 2014-01-29 NOTE — Progress Notes (Signed)
   Subjective:    Patient ID: HITOMI SLAPE, female    DOB: 03-08-1976, 38 y.o.   MRN: [id number]  HPI  38 year old Female who has experienced abdominal pain bloating and gurgling of the past 8 weeks. Has been constipated having only one bowel movement a day and previously had a couple of bowel movements daily. Last evening had reflux symptoms. Has had to strain to pass bowel movements recently. Has noticed generalized abdominal pain that is concerning her.  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 40-year-old son has had some issues in preschool. She is a 63-month-old daughter as well.    Review of Systems     Objective:   Physical Exam Abdomen is soft, slightly tympanic, no hepatosplenomegaly masses or tenderness. Rectal exam is normal. No stool to guaiac. KUB flat and upright: Moderate feces throughout colon.  Urinalysis is normal.        Assessment & Plan:  Functional constipation  Plan: Recommend Senokot 2 tablets tonight followed by daily MiraLAX. If symptoms do not improve, we'll consider Amitiza. She has been taking probiotics and may continue those.  25 minutes spent with patient

## 2014-01-29 NOTE — Patient Instructions (Signed)
Take 2 Senokot tablets tonight and begin daily MiraLAX tomorrow. If symptoms do not improve in the next 10 days call back.

## 2014-03-21 ENCOUNTER — Encounter: Payer: Self-pay | Admitting: Nurse Practitioner

## 2014-03-28 ENCOUNTER — Ambulatory Visit: Payer: BC Managed Care – PPO | Admitting: Nurse Practitioner

## 2014-05-07 ENCOUNTER — Encounter: Payer: Self-pay | Admitting: Gastroenterology

## 2014-05-07 ENCOUNTER — Ambulatory Visit (INDEPENDENT_AMBULATORY_CARE_PROVIDER_SITE_OTHER): Payer: BC Managed Care – PPO | Admitting: Gastroenterology

## 2014-05-07 VITALS — BP 110/68 | HR 72 | Ht 67.75 in | Wt 148.6 lb

## 2014-05-07 DIAGNOSIS — K589 Irritable bowel syndrome without diarrhea: Secondary | ICD-10-CM

## 2014-05-07 MED ORDER — HYOSCYAMINE SULFATE ER 0.375 MG PO TBCR: EXTENDED_RELEASE_TABLET | ORAL | Status: DC

## 2014-05-07 NOTE — Assessment & Plan Note (Signed)
The patient's symptoms are compatible with constipation predominant IBS.  There are no ominous signs including rectal bleeding.  There are no obvious dietary triggers.  Recommendations #1 trial of lactose-free diet for 4-5 days.  If not improved she was instructed to take a careful dietary history when she is symptomatic.  Depending upon her response may consider any FODMAP diet #2 fiber supplementation #3 hyomax when necessary

## 2014-05-07 NOTE — Patient Instructions (Signed)
Follow up in 2 months  Irritable Bowel Syndrome Irritable bowel syndrome (IBS) is caused by a disturbance of normal bowel function and is a common digestive disorder. You may also hear this condition called spastic colon, mucous colitis, and irritable colon. There is no cure for IBS. However, symptoms often gradually improve or disappear with a good diet, stress management, and medicine. This condition usually appears in late adolescence or early adulthood. Women develop it twice as often as men. CAUSES  After food has been digested and absorbed in the small intestine, waste material is moved into the large intestine, or colon. In the colon, water and salts are absorbed from the undigested products coming from the small intestine. The remaining residue, or fecal material, is held for elimination. Under normal circumstances, gentle, rhythmic contractions of the bowel walls push the fecal material along the colon toward the rectum. In IBS, however, these contractions are irregular and poorly coordinated. The fecal material is either retained too long, resulting in constipation, or expelled too soon, producing diarrhea. SIGNS AND SYMPTOMS  The most common symptom of IBS is abdominal pain. It is often in the lower left side of the abdomen, but it may occur anywhere in the abdomen. The pain comes from spasms of the bowel muscles happening too much and from the buildup of gas and fecal material in the colon. This pain:  Can range from sharp abdominal cramps to a dull, continuous ache.  Often worsens soon after eating.  Is often relieved by having a bowel movement or passing gas. Abdominal pain is usually accompanied by constipation, but it may also produce diarrhea. The diarrhea often occurs right after a meal or upon waking up in the morning. The stools are often soft, watery, and flecked with mucus. Other symptoms of IBS include:  Bloating.  Loss of appetite.  Heartburn.  Backache.  Dull pain in  the arms or shoulders.  Nausea.  Burping.  Vomiting.  Gas. IBS may also cause symptoms that are unrelated to the digestive system, such as:  Fatigue.  Headaches.  Anxiety.  Shortness of breath.  Trouble concentrating.  Dizziness. These symptoms tend to come and go. DIAGNOSIS  The symptoms of IBS may seem like symptoms of other, more serious digestive disorders. Your health care provider may want to perform tests to exclude these disorders.  TREATMENT Many medicines are available to help correct bowel function or relieve bowel spasms and abdominal pain. Among the medicines available are:  Laxatives for severe constipation and to help restore normal bowel habits.  Specific antidiarrheal medicines to treat severe or lasting diarrhea.  Antispasmodic agents to relieve intestinal cramps. Your health care provider may also decide to treat you with a mild tranquilizer or sedative during unusually stressful periods in your life. Your health care provider may also prescribe antidepressant medicine. The use of this medicine has been shown to reduce pain and other symptoms of IBS. Remember that if any medicine is prescribed for you, you should take it exactly as directed. Make sure your health care provider knows how well it worked for you. HOME CARE INSTRUCTIONS   Take all medicines as directed by your health care provider.  Avoid foods that are high in fat or oils, such as heavy cream, butter, frankfurters, sausage, and other fatty meats.  Avoid foods that make you go to the bathroom, such as fruit, fruit juice, and dairy products.  Cut out carbonated drinks, chewing gum, and "gassy" foods such as beans and cabbage. This  may help relieve bloating and burping.  Eat foods with bran, and drink plenty of liquids with the bran foods. This helps relieve constipation.  Keep track of what foods seem to bring on your symptoms.  Avoid emotionally charged situations or circumstances that  produce anxiety.  Start or continue exercising.  Get plenty of rest and sleep. Document Released: 09/07/2005 Document Revised: 09/12/2013 Document Reviewed: 04/27/2008 [facility] Patient Information 2015 [place], [place]. This information is not intended to replace advice given to you by your health care provider. Make sure you discuss any questions you have with your health care provider.

## 2014-05-07 NOTE — Progress Notes (Signed)
_                                                                                                                History of Present Illness:  Dawn Mcmillan is a pleasant 38 year old white female referred for evaluation of abdominal discomfort and constipation.  She claims to have IBS and has had periods of severe constipation characterized by inability to move her bowels more than once or perhaps twice a week  accompanied by bloating, excess gas and abdominal discomfort.  At times she's had moderately severe lower abdominal pain.  In the past 2 weeks she has been moving her bowels almost daily but still has abdominal discomfort, bloating and excess gas.  Aside from fried foods she is unaware of other specific foods causing symptoms.  She denies rectal bleeding or weight loss.   Past Medical History  Diagnosis Date  . No pertinent past medical history   . H/O varicella   . AMA (advanced maternal age) multigravida 23+   . Irritable bowel syndrome   . Hyperlipemia    Past Surgical History  Procedure Laterality Date  . Pilonidal cyst excision  2003  . Dilation and curettage of uterus      x 2  . Wisdom tooth extraction     family history includes Arthritis in her mother; Diabetes in her maternal grandmother and paternal grandmother; Heart disease in her father and paternal grandfather; Hypertension in her father; Hypothyroidism in her mother; Thrombophlebitis in her maternal grandmother and mother. Current Outpatient Prescriptions  Medication Sig Dispense Refill  . Probiotic Product (ALIGN) 4 MG CAPS Take 1 capsule by mouth daily.       No current facility-administered medications for this visit.   Allergies as of 05/07/2014  . (No Known Allergies)    reports that she quit smoking about 12 years ago. She has never used smokeless tobacco. She reports that she drinks alcohol. She reports that she does not use illicit drugs.   Review of Systems: Pertinent positive and  negative review of systems were noted in the above HPI section. All other review of systems were otherwise negative.  Vital signs were reviewed in today's medical record Physical Exam: General: Well developed , well nourished, no acute distress Skin: anicteric Head: Normocephalic and atraumatic Eyes:  sclerae anicteric, EOMI Ears: Normal auditory acuity Mouth: No deformity or lesions Neck: Supple, no masses or thyromegaly Lungs: Clear throughout to auscultation Heart: Regular rate and rhythm; no murmurs, rubs or bruits Abdomen: Soft,  and non distended. No masses, hepatosplenomegaly or hernias noted. Normal Bowel sounds.  There is minimal tenderness in the left lower quadrant to deep palpation Rectal:deferred Musculoskeletal: Symmetrical with no gross deformities  Skin: No lesions on visible extremities Pulses:  Normal pulses noted Extremities: No clubbing, cyanosis, edema or deformities noted Neurological: Alert oriented x 4, grossly nonfocal Cervical Nodes:  No significant cervical adenopathy Inguinal Nodes: No significant inguinal adenopathy Psychological:  Alert and cooperative. Normal mood and affect  See Assessment and  Plan under Problem List

## 2014-05-24 ENCOUNTER — Other Ambulatory Visit: Payer: BC Managed Care – PPO | Admitting: Internal Medicine

## 2014-05-24 DIAGNOSIS — Z131 Encounter for screening for diabetes mellitus: Secondary | ICD-10-CM

## 2014-05-24 DIAGNOSIS — Z130 Encounter for screening for diseases of the blood and blood-forming organs and certain disorders involving the immune mechanism: Secondary | ICD-10-CM

## 2014-05-24 DIAGNOSIS — Z1329 Encounter for screening for other suspected endocrine disorder: Secondary | ICD-10-CM

## 2014-05-24 DIAGNOSIS — Z13228 Encounter for screening for other metabolic disorders: Principal | ICD-10-CM

## 2014-05-24 DIAGNOSIS — Z13220 Encounter for screening for lipoid disorders: Secondary | ICD-10-CM

## 2014-05-24 LAB — CBC WITH DIFFERENTIAL/PLATELET
Basophils Absolute: 0 10*3/uL (ref 0.0–0.1)
Basophils Relative: 0 % (ref 0–1)
EOS PCT: 1 % (ref 0–5)
Eosinophils Absolute: 0.1 10*3/uL (ref 0.0–0.7)
HEMATOCRIT: 41.5 % (ref 36.0–46.0)
Hemoglobin: 13.9 g/dL (ref 12.0–15.0)
LYMPHS ABS: 1.8 10*3/uL (ref 0.7–4.0)
LYMPHS PCT: 29 % (ref 12–46)
MCH: 30.3 pg (ref 26.0–34.0)
MCHC: 33.5 g/dL (ref 30.0–36.0)
MCV: 90.4 fL (ref 78.0–100.0)
MONO ABS: 0.2 10*3/uL (ref 0.1–1.0)
Monocytes Relative: 4 % (ref 3–12)
Neutro Abs: 4 10*3/uL (ref 1.7–7.7)
Neutrophils Relative %: 66 % (ref 43–77)
Platelets: 227 10*3/uL (ref 150–400)
RBC: 4.59 MIL/uL (ref 3.87–5.11)
RDW: 13.3 % (ref 11.5–15.5)
WBC: 6.1 10*3/uL (ref 4.0–10.5)

## 2014-05-24 LAB — TSH: TSH: 1.037 u[IU]/mL (ref 0.350–4.500)

## 2014-05-24 LAB — COMPREHENSIVE METABOLIC PANEL
ALT: 9 U/L (ref 0–35)
AST: 12 U/L (ref 0–37)
Albumin: 4.3 g/dL (ref 3.5–5.2)
Alkaline Phosphatase: 49 U/L (ref 39–117)
BUN: 16 mg/dL (ref 6–23)
CALCIUM: 9.2 mg/dL (ref 8.4–10.5)
CHLORIDE: 107 meq/L (ref 96–112)
CO2: 23 meq/L (ref 19–32)
CREATININE: 0.93 mg/dL (ref 0.50–1.10)
GLUCOSE: 85 mg/dL (ref 70–99)
Potassium: 4.4 mEq/L (ref 3.5–5.3)
Sodium: 140 mEq/L (ref 135–145)
Total Bilirubin: 0.5 mg/dL (ref 0.2–1.2)
Total Protein: 6.8 g/dL (ref 6.0–8.3)

## 2014-05-24 LAB — LIPID PANEL
CHOLESTEROL: 180 mg/dL (ref 0–200)
HDL: 53 mg/dL (ref >39)
LDL CALC: 108 mg/dL — AB (ref 0–99)
TRIGLYCERIDES: 97 mg/dL (ref <150)
Total CHOL/HDL Ratio: 3.4 Ratio
VLDL: 19 mg/dL (ref 0–40)

## 2014-05-25 LAB — VITAMIN D 25 HYDROXY (VIT D DEFICIENCY, FRACTURES): Vit D, 25-Hydroxy: 32 ng/mL (ref 30–89)

## 2014-05-29 ENCOUNTER — Encounter: Payer: Self-pay | Admitting: Internal Medicine

## 2014-05-29 ENCOUNTER — Ambulatory Visit (INDEPENDENT_AMBULATORY_CARE_PROVIDER_SITE_OTHER): Payer: BC Managed Care – PPO | Admitting: Internal Medicine

## 2014-05-29 VITALS — BP 108/78 | HR 60 | Ht 67.75 in | Wt 148.0 lb

## 2014-05-29 DIAGNOSIS — K589 Irritable bowel syndrome without diarrhea: Secondary | ICD-10-CM

## 2014-05-29 DIAGNOSIS — Z0000 Encounter for general adult medical examination without abnormal findings: Secondary | ICD-10-CM

## 2014-05-29 LAB — POCT URINALYSIS DIPSTICK
BILIRUBIN UA: NEGATIVE
GLUCOSE UA: NEGATIVE
Ketones, UA: NEGATIVE
Leukocytes, UA: NEGATIVE
NITRITE UA: NEGATIVE
Protein, UA: NEGATIVE
RBC UA: NEGATIVE
SPEC GRAV UA: 1.01
Urobilinogen, UA: NEGATIVE
pH, UA: 7.5

## 2014-06-20 NOTE — Progress Notes (Signed)
   Subjective:    Patient ID: Dawn Mcmillan, female    DOB: 1976-06-15, 38 y.o.   MRN: [id number]  HPI  38 year old female for health maintenance exam.   No history of serious illnesses accidents or operations  Family history: Parents with history of hyperlipidemia. Maternal grandmother with history of dementia.  Social history: Married with 2 children. She said she had 3 miscarriages before having a successful pregnancy. She works full-time. Goes to [employer] for exercise.    Review of Systems  history of irritable bowel syndrome     Objective:   Physical Exam  Vitals reviewed. Constitutional: She is oriented to person, place, and time. She appears well-developed and well-nourished. No distress.  HENT:  Head: Normocephalic and atraumatic.  Right Ear: External ear normal.  Left Ear: External ear normal.  Mouth/Throat: Oropharynx is clear and moist. No oropharyngeal exudate.  Eyes: Conjunctivae are normal. Pupils are equal, round, and reactive to light. Right eye exhibits no discharge. Left eye exhibits no discharge. No scleral icterus.  Neck: Neck supple.  Cardiovascular: Normal rate, regular rhythm and normal heart sounds.   No murmur heard. Pulmonary/Chest: Effort normal and breath sounds normal. No respiratory distress. She has no wheezes. She has no rales.  Breasts normal female  Abdominal: Soft. Bowel sounds are normal. She exhibits no distension and no mass. There is no tenderness. There is no rebound and no guarding.  Genitourinary:  Deferred to GYN  Musculoskeletal: She exhibits no edema.  Neurological: She is alert and oriented to person, place, and time. She has normal reflexes. No cranial nerve deficit. Coordination normal.  Skin: Skin is warm and dry. No rash noted. She is not diaphoretic.  Psychiatric: She has a normal mood and affect. Her behavior is normal. Judgment and thought content normal.          Assessment & Plan:  Normal health maintenance  exam  Plan: Continue GYN care yearly. Return in 2 years or as needed. Mammogram at age 9. Markedly improved LDL cholesterol from 139-108 with diet and exercise over the past year

## 2014-06-20 NOTE — Patient Instructions (Signed)
Continue diet and exercise. Return in 2 years or as needed. See GYN yearly.

## 2014-07-23 ENCOUNTER — Other Ambulatory Visit: Payer: Self-pay | Admitting: Obstetrics and Gynecology

## 2014-07-23 ENCOUNTER — Encounter: Payer: Self-pay | Admitting: Internal Medicine

## 2014-07-23 DIAGNOSIS — N644 Mastodynia: Secondary | ICD-10-CM

## 2014-08-01 ENCOUNTER — Ambulatory Visit
Admission: RE | Admit: 2014-08-01 | Discharge: 2014-08-01 | Disposition: A | Discharge status: Home / Self Care | Payer: BC Managed Care – PPO | Source: Ambulatory Visit | Attending: Obstetrics and Gynecology | Admitting: Obstetrics and Gynecology

## 2014-08-01 DIAGNOSIS — N644 Mastodynia: Secondary | ICD-10-CM

## 2014-12-10 ENCOUNTER — Other Ambulatory Visit: Payer: Self-pay | Admitting: Obstetrics and Gynecology

## 2014-12-12 LAB — CYTOLOGY - PAP

## 2015-01-21 ENCOUNTER — Other Ambulatory Visit: Payer: Self-pay | Admitting: Obstetrics and Gynecology

## 2015-01-21 DIAGNOSIS — N6001 Solitary cyst of right breast: Secondary | ICD-10-CM

## 2015-01-31 ENCOUNTER — Ambulatory Visit
Admission: RE | Admit: 2015-01-31 | Discharge: 2015-01-31 | Disposition: A | Discharge status: Home / Self Care | Payer: BLUE CROSS/BLUE SHIELD | Source: Ambulatory Visit | Attending: Obstetrics and Gynecology | Admitting: Obstetrics and Gynecology

## 2015-01-31 DIAGNOSIS — N6001 Solitary cyst of right breast: Secondary | ICD-10-CM

## 2015-04-23 ENCOUNTER — Encounter: Payer: Self-pay | Admitting: Internal Medicine

## 2015-04-23 ENCOUNTER — Ambulatory Visit (INDEPENDENT_AMBULATORY_CARE_PROVIDER_SITE_OTHER): Payer: BLUE CROSS/BLUE SHIELD | Admitting: Internal Medicine

## 2015-04-23 VITALS — BP 110/72 | HR 72 | Temp 97.3°F | Wt 141.0 lb

## 2015-04-23 DIAGNOSIS — R002 Palpitations: Secondary | ICD-10-CM

## 2015-04-23 LAB — CBC WITH DIFFERENTIAL/PLATELET
Basophils Absolute: 0 10*3/uL (ref 0.0–0.1)
Basophils Relative: 0 % (ref 0–1)
EOS ABS: 0 10*3/uL (ref 0.0–0.7)
EOS PCT: 0 % (ref 0–5)
HEMATOCRIT: 39.7 % (ref 36.0–46.0)
HEMOGLOBIN: 13.3 g/dL (ref 12.0–15.0)
Lymphocytes Relative: 20 % (ref 12–46)
Lymphs Abs: 2.3 10*3/uL (ref 0.7–4.0)
MCH: 30.9 pg (ref 26.0–34.0)
MCHC: 33.5 g/dL (ref 30.0–36.0)
MCV: 92.1 fL (ref 78.0–100.0)
MPV: 9.1 fL (ref 8.6–12.4)
Monocytes Absolute: 0.5 10*3/uL (ref 0.1–1.0)
Monocytes Relative: 4 % (ref 3–12)
Neutro Abs: 8.7 10*3/uL — ABNORMAL HIGH (ref 1.7–7.7)
Neutrophils Relative %: 76 % (ref 43–77)
Platelets: 271 10*3/uL (ref 150–400)
RBC: 4.31 MIL/uL (ref 3.87–5.11)
RDW: 13.1 % (ref 11.5–15.5)
WBC: 11.4 10*3/uL — ABNORMAL HIGH (ref 4.0–10.5)

## 2015-04-23 LAB — T4, FREE: FREE T4: 1.08 ng/dL (ref 0.80–1.80)

## 2015-04-23 LAB — TSH: TSH: 0.889 u[IU]/mL (ref 0.350–4.500)

## 2015-04-24 ENCOUNTER — Telehealth: Payer: Self-pay | Admitting: *Deleted

## 2015-04-24 NOTE — Telephone Encounter (Signed)
Left message with lab results on patient voice mail 

## 2015-05-20 NOTE — Progress Notes (Signed)
   Subjective:    Patient ID: Dawn Mcmillan, female    DOB: July 11, 1976, 39 y.o.   MRN: [id number]  HPI 39 year old White female in today complaining of palpitations and chest discomfort. Has been under some stress. No history of serious illnesses accidents or operations.  Family history: Parents with history of hyperlipidemia. Maternal grandmother with history of dementia.  Social history: She is married with 2 children. She works full-time and goes to [employer] for exercise.   History of irritable bowel syndrome.  Does not consume excessive amounts of caffeine. Has not been taking over-the-counter decongestants or phentermine. Does not take attention deficit disorder medication.  Has GYN physician.    Review of Systems     Objective:   Physical Exam Skin warm and dry. Nodes none. Neck is supple without JVD thyromegaly or carotid bruits. Chest clear to auscultation. Cardiac exam regular rate and rhythm normal S1 and S2 without clicks or murmurs. Extremities without edema. No abdominal bruits. EKG is within normal limits.       Assessment & Plan:  Palpitations-not sustain for prolonged period of time  Plan: Patient does not smoke. Does not take over-the-counter decongestants or phentermine. Has been under some stress. Doesn't consume excessive amounts of caffeine. EKG is within normal limits. Check free T4 and TSH. Check CBC with differential. Reassure patient. If symptoms persist consider cardiology consultation and/or 24-hour Holter monitor.

## 2015-05-20 NOTE — Patient Instructions (Signed)
Reassure patient. EKG is within normal limits. Check free T4 TSH and CBC with differential. If symptoms persist, refer to cardiology.

## 2015-05-30 ENCOUNTER — Other Ambulatory Visit: Payer: BLUE CROSS/BLUE SHIELD | Admitting: Internal Medicine

## 2015-05-30 DIAGNOSIS — Z0000 Encounter for general adult medical examination without abnormal findings: Secondary | ICD-10-CM

## 2015-05-30 DIAGNOSIS — Z1329 Encounter for screening for other suspected endocrine disorder: Secondary | ICD-10-CM

## 2015-05-30 DIAGNOSIS — Z13220 Encounter for screening for lipoid disorders: Secondary | ICD-10-CM

## 2015-05-30 DIAGNOSIS — Z130 Encounter for screening for diseases of the blood and blood-forming organs and certain disorders involving the immune mechanism: Secondary | ICD-10-CM

## 2015-05-30 DIAGNOSIS — Z1321 Encounter for screening for nutritional disorder: Secondary | ICD-10-CM

## 2015-05-30 LAB — CBC WITH DIFFERENTIAL/PLATELET
BASOS ABS: 0 10*3/uL (ref 0.0–0.1)
BASOS PCT: 0 % (ref 0–1)
EOS ABS: 0.2 10*3/uL (ref 0.0–0.7)
Eosinophils Relative: 3 % (ref 0–5)
HCT: 41.9 % (ref 36.0–46.0)
Hemoglobin: 14 g/dL (ref 12.0–15.0)
Lymphocytes Relative: 26 % (ref 12–46)
Lymphs Abs: 1.8 10*3/uL (ref 0.7–4.0)
MCH: 31.3 pg (ref 26.0–34.0)
MCHC: 33.4 g/dL (ref 30.0–36.0)
MCV: 93.7 fL (ref 78.0–100.0)
MPV: 9.1 fL (ref 8.6–12.4)
Monocytes Absolute: 0.4 10*3/uL (ref 0.1–1.0)
Monocytes Relative: 6 % (ref 3–12)
NEUTROS PCT: 65 % (ref 43–77)
Neutro Abs: 4.4 10*3/uL (ref 1.7–7.7)
PLATELETS: 210 10*3/uL (ref 150–400)
RBC: 4.47 MIL/uL (ref 3.87–5.11)
RDW: 13.2 % (ref 11.5–15.5)
WBC: 6.8 10*3/uL (ref 4.0–10.5)

## 2015-05-30 LAB — COMPLETE METABOLIC PANEL WITH GFR
ALT: 11 U/L (ref 6–29)
AST: 14 U/L (ref 10–30)
Albumin: 4.2 g/dL (ref 3.6–5.1)
Alkaline Phosphatase: 42 U/L (ref 33–115)
BILIRUBIN TOTAL: 0.4 mg/dL (ref 0.2–1.2)
BUN: 14 mg/dL (ref 7–25)
CO2: 24 mmol/L (ref 20–31)
CREATININE: 0.83 mg/dL (ref 0.50–1.10)
Calcium: 8.9 mg/dL (ref 8.6–10.2)
Chloride: 105 mmol/L (ref 98–110)
GFR, Est African American: >89 mL/min (ref >=60)
GLUCOSE: 88 mg/dL (ref 65–99)
Potassium: 4.4 mmol/L (ref 3.5–5.3)
SODIUM: 138 mmol/L (ref 135–146)
Total Protein: 6.6 g/dL (ref 6.1–8.1)

## 2015-05-30 LAB — LIPID PANEL
Cholesterol: 173 mg/dL (ref 125–200)
HDL: 54 mg/dL (ref >=46)
LDL Cholesterol: 95 mg/dL (ref <130)
Total CHOL/HDL Ratio: 3.2 Ratio (ref <=5.0)
Triglycerides: 120 mg/dL (ref <150)
VLDL: 24 mg/dL (ref <30)

## 2015-05-30 LAB — TSH: TSH: 0.966 u[IU]/mL (ref 0.350–4.500)

## 2015-05-31 LAB — VITAMIN D 25 HYDROXY (VIT D DEFICIENCY, FRACTURES): Vit D, 25-Hydroxy: 26 ng/mL — ABNORMAL LOW (ref 30–100)

## 2015-06-04 ENCOUNTER — Ambulatory Visit (INDEPENDENT_AMBULATORY_CARE_PROVIDER_SITE_OTHER): Payer: BLUE CROSS/BLUE SHIELD | Admitting: Internal Medicine

## 2015-06-04 VITALS — BP 108/68 | HR 75 | Temp 98.1°F | Ht 68.0 in | Wt 142.0 lb

## 2015-06-04 DIAGNOSIS — Z23 Encounter for immunization: Secondary | ICD-10-CM

## 2015-06-04 DIAGNOSIS — Z0000 Encounter for general adult medical examination without abnormal findings: Secondary | ICD-10-CM | POA: Diagnosis not present

## 2015-06-04 DIAGNOSIS — E559 Vitamin D deficiency, unspecified: Secondary | ICD-10-CM | POA: Diagnosis not present

## 2015-06-04 LAB — POCT URINALYSIS DIPSTICK
Bilirubin, UA: NEGATIVE
Blood, UA: NEGATIVE
Glucose, UA: NEGATIVE
Ketones, UA: NEGATIVE
LEUKOCYTES UA: NEGATIVE
Nitrite, UA: NEGATIVE
PROTEIN UA: NEGATIVE
SPEC GRAV UA: 1.01
UROBILINOGEN UA: NEGATIVE
pH, UA: 6

## 2015-06-18 ENCOUNTER — Encounter: Payer: Self-pay | Admitting: Internal Medicine

## 2015-06-18 ENCOUNTER — Ambulatory Visit (INDEPENDENT_AMBULATORY_CARE_PROVIDER_SITE_OTHER): Payer: BLUE CROSS/BLUE SHIELD | Admitting: Internal Medicine

## 2015-06-18 VITALS — BP 106/64 | HR 75 | Temp 98.1°F | Ht 68.0 in | Wt 142.0 lb

## 2015-06-18 DIAGNOSIS — M545 Low back pain, unspecified: Secondary | ICD-10-CM

## 2015-06-18 DIAGNOSIS — E559 Vitamin D deficiency, unspecified: Secondary | ICD-10-CM | POA: Insufficient documentation

## 2015-06-18 MED ORDER — CYCLOBENZAPRINE HCL 10 MG PO TABS: ORAL_TABLET | ORAL | Status: DC

## 2015-06-18 MED ORDER — MELOXICAM 15 MG PO TABS: 15.0000 mg | ORAL_TABLET | Freq: Every day | ORAL | Status: DC

## 2015-06-18 NOTE — Patient Instructions (Signed)
Was a pleasure to see you today. Please take 2000 units vitamin D 3 daily. Return in 2 years at age 39 for physical exam and see GYN yearly.

## 2015-06-18 NOTE — Progress Notes (Signed)
   Subjective:    Patient ID: Dawn Mcmillan, female    DOB: [birth year], 39 y.o.   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 When she stood up had excruciating low back pain. Does not radiate into the legs. No numbness or tingling in lower extremities. Never had back pain previously except with one pregnancy. Recently started taking some jazz dance classes. Children multiply picked up frequently.    Review of Systems     Objective:   Physical Exam  Straight leg raising is positive bilaterally at 90. Deep tendon reflexes in the lower extremity stoup plus and symmetrical. Muscle strength is normal in the lower extremity. Decreased range of motion in the trunk secondary to pain with flexion and extension of trunk.        Assessment & Plan:  Bilateral low back pain without sciatica  Plan: Mobic 15 mg daily #30 no refill. Flexeril 10 mg #30 one half to one tablet 3 times daily as needed for back spasm. No dance class or heavy lifting for 2 weeks. Call if not better in 10 days or sooner if worse.

## 2015-06-18 NOTE — Progress Notes (Signed)
   Subjective:    Patient ID: Dawn Mcmillan, female    DOB: Dec 30, 1975, 39 y.o.   MRN: [id number]  HPI  39 year old Female in today for health maintenance exam. No history of serious illnesses, accidents or operations. General health is good.  Family history: Parents with history of hyperlipidemia. Maternal grandmother with history of dementia. Older brother with hyperlipidemia. Father with history of detached retina and hip replacements.  Social history: Married with 2 children. She said she had 3 miscarriages before having a successful pregnancy. She works full-time. Goes to Pil[employer] for exercise and recently started taking a dance class.     Review of Systems  Constitutional: Negative.   Gastrointestinal:       History of irritable bowel syndrome  All other systems reviewed and are negative.      Objective:   Physical Exam  Constitutional: She is oriented to person, place, and time. She appears well-developed and well-nourished. No distress.  HENT:  Head: Normocephalic and atraumatic.  Right Ear: External ear normal.  Left Ear: External ear normal.  Mouth/Throat: Oropharynx is clear and moist. No oropharyngeal exudate.  Eyes: Conjunctivae and EOM are normal. Pupils are equal, round, and reactive to light. Right eye exhibits no discharge. Left eye exhibits no discharge. No scleral icterus.  Neck: Neck supple. No JVD present. No thyromegaly present.  Cardiovascular: Normal rate, regular rhythm, normal heart sounds and intact distal pulses.   No murmur heard. Pulmonary/Chest: Effort normal and breath sounds normal. No respiratory distress. She has no wheezes. She has no rales.  Abdominal: Soft. Bowel sounds are normal. She exhibits no distension and no mass. There is no tenderness. There is no rebound and no guarding.  Genitourinary:  Deferred to GYN  Musculoskeletal: She exhibits no edema.  Lymphadenopathy:    She has no cervical adenopathy.  Neurological: She is alert and  oriented to person, place, and time. She has normal reflexes. No cranial nerve deficit.  Skin: Skin is warm and dry. No rash noted. She is not diaphoretic.  Psychiatric: She has a normal mood and affect. Her behavior is normal. Judgment and thought content normal.  Vitals reviewed.         Assessment & Plan:  Normal health maintenance exam  Vitamin D deficiency  Plan: Continue to see GYN yearly. Return at age 55 for physical examination. Remind pt to take 2000 units vitamin D 3 daily.

## 2015-06-18 NOTE — Patient Instructions (Signed)
No heavy lifting or dance class for the next 2 weeks. Take Flexeril and mobile because prescribed. Call if not better in 10 days or so.

## 2015-08-06 ENCOUNTER — Other Ambulatory Visit: Payer: Self-pay | Admitting: Obstetrics and Gynecology

## 2015-08-06 ENCOUNTER — Other Ambulatory Visit: Payer: Self-pay

## 2015-08-06 DIAGNOSIS — N6001 Solitary cyst of right breast: Secondary | ICD-10-CM

## 2015-08-13 ENCOUNTER — Ambulatory Visit
Admission: RE | Admit: 2015-08-13 | Discharge: 2015-08-13 | Disposition: A | Discharge status: Home / Self Care | Payer: BLUE CROSS/BLUE SHIELD | Source: Ambulatory Visit | Attending: Obstetrics and Gynecology | Admitting: Obstetrics and Gynecology

## 2015-08-13 DIAGNOSIS — N6001 Solitary cyst of right breast: Secondary | ICD-10-CM

## 2015-12-04 ENCOUNTER — Telehealth: Payer: Self-pay

## 2015-12-04 NOTE — Telephone Encounter (Signed)
Patient states that she has been abdominal pain and has some bleeding with bowel movement on and off, but today was more significant. She states that she has been dx: w/ IBS- 6 years ago. Please advise on when to see as there are no appt for tomorrow morning, CB# [phone]

## 2015-12-04 NOTE — Telephone Encounter (Signed)
She saw Dr. Alben Spittle in 2015 at [place] for same issues. Suspect she is constipated. Has she tried Miralax?

## 2015-12-05 NOTE — Telephone Encounter (Signed)
Patient states that she is not constipated; has used miralax; she states that she is having regular BM, but she is concerned about the blood. She states that Dr. Deatra Ina is no longer there. She wants to know if she should see you or go ahead and see the specialist.

## 2015-12-05 NOTE — Telephone Encounter (Signed)
Patient states that she has a conflict at [id number] and is leaving to go out of town. She has been scheduled for Monday 3/20 @ 11:45

## 2015-12-05 NOTE — Telephone Encounter (Signed)
Please book appt tomorrow here

## 2015-12-09 ENCOUNTER — Ambulatory Visit (INDEPENDENT_AMBULATORY_CARE_PROVIDER_SITE_OTHER): Payer: BLUE CROSS/BLUE SHIELD | Admitting: Internal Medicine

## 2015-12-09 ENCOUNTER — Encounter: Payer: Self-pay | Admitting: Internal Medicine

## 2015-12-09 VITALS — BP 94/60 | HR 68 | Temp 98.3°F | Resp 20 | Ht 68.0 in | Wt 145.0 lb

## 2015-12-09 DIAGNOSIS — K625 Hemorrhage of anus and rectum: Secondary | ICD-10-CM | POA: Diagnosis not present

## 2015-12-09 DIAGNOSIS — K582 Mixed irritable bowel syndrome: Secondary | ICD-10-CM | POA: Diagnosis not present

## 2015-12-09 LAB — HEMOCCULT GUIAC POC 1CARD (OFFICE): FECAL OCCULT BLD: NEGATIVE

## 2015-12-09 MED ORDER — DICYCLOMINE HCL 20 MG PO TABS: ORAL_TABLET | ORAL | Status: DC

## 2015-12-09 NOTE — Progress Notes (Signed)
   Subjective:    Patient ID: Dawn Mcmillan, female    DOB: 09-11-1976, 40 y.o.   MRN: [id number]  HPI Patient has long-standing history for several years of irritable bowel type symptoms. Has alternating constipation and diarrhea. She saw Dr. Erskine Emery in August 2015 who diagnosed her with irritable bowel syndrome and advised daily MiraLAX. Symptoms seem to be worse recently. However, husband has been out of work for 2 months and just recently found a new job. She's been under some job stress as well. She has 2 children.  Patient has also noticed some recent rectal bleeding. This is subsided over the weekend.  She has some concerns about taking daily MiraLAX because of long-term side effects. Explained her did not think there was any reason to be concerned about that    Review of Systems     Objective:   Physical Exam She has some mild tenderness in her right lower quadrant without rebound tenderness. No hepatosplenomegaly masses or tenderness. Bowel sounds are active. Skin is warm and dry. Cardiac exam normal. Chest clear to auscultation.  Rectal exam shows small amount of stool in rectal vault which is guaiac negative. Anoscopy shows internal hemorrhoids but no active bleeding.       Assessment & Plan:  Probable irritable bowel syndrome  Rectal bleeding-likely due to internal hemorrhoids-seems to come on with diarrhea  Plan: Irritable bowel handout provided. She will take MiraLAX at least every other day. I have also prescribed Bentyl 20 mg to take twice daily particular when she has diarrhea. I think we  probably need to rule out inflammatory bowel disease with her. She's very anxious about her health. Have referred her to gastroenterologist once again.

## 2015-12-09 NOTE — Patient Instructions (Addendum)
Try Bentyl 20 mg twice a day. GI consultation pending. Try to watch diet and see if certain foods aggravate symptoms. Handout given on irritable bowel syndrome.

## 2015-12-16 ENCOUNTER — Other Ambulatory Visit: Payer: Self-pay | Admitting: Obstetrics and Gynecology

## 2015-12-17 LAB — CYTOLOGY - PAP

## 2016-01-16 ENCOUNTER — Ambulatory Visit: Payer: BLUE CROSS/BLUE SHIELD | Admitting: Gastroenterology

## 2016-01-22 ENCOUNTER — Other Ambulatory Visit (INDEPENDENT_AMBULATORY_CARE_PROVIDER_SITE_OTHER): Payer: BLUE CROSS/BLUE SHIELD

## 2016-01-22 ENCOUNTER — Encounter: Payer: Self-pay | Admitting: Gastroenterology

## 2016-01-22 ENCOUNTER — Ambulatory Visit (INDEPENDENT_AMBULATORY_CARE_PROVIDER_SITE_OTHER): Payer: BLUE CROSS/BLUE SHIELD | Admitting: Gastroenterology

## 2016-01-22 VITALS — BP 98/60 | HR 72 | Ht 68.0 in | Wt 144.0 lb

## 2016-01-22 DIAGNOSIS — K625 Hemorrhage of anus and rectum: Secondary | ICD-10-CM

## 2016-01-22 DIAGNOSIS — R194 Change in bowel habit: Secondary | ICD-10-CM

## 2016-01-22 LAB — IGA: IgA: 181 mg/dL (ref 68–378)

## 2016-01-22 MED ORDER — RIFAXIMIN 550 MG PO TABS: 550.0000 mg | ORAL_TABLET | Freq: Three times a day (TID) | ORAL | Status: DC

## 2016-01-22 MED ORDER — NA SULFATE-K SULFATE-MG SULF 17.5-3.13-1.6 GM/177ML PO SOLN: 1.0000 | Freq: Once | ORAL | Status: DC

## 2016-01-22 NOTE — Patient Instructions (Signed)
Go to the basement for labs today Low FODMAP diet given We will send your prescriptions to your pharmacy  You have been scheduled for a colonoscopy. Please follow written instructions given to you at your visit today.  Please pick up your prep supplies at the pharmacy within the next 1-3 days. If you use inhalers (even only as needed), please bring them with you on the day of your procedure. Your physician has requested that you go to www.startemmi.com and enter the access code given to you at your visit today. This web site gives a general overview about your procedure. However, you should still follow specific instructions given to you by our office regarding your preparation for the procedure.

## 2016-01-22 NOTE — Progress Notes (Signed)
HPI :  40 y/o female here for a follow up visit for suspected IBS. Former patient of Dr. Deatra Ina, last seen in 2015.  Patient reports abdominal / bowel symptoms which started about 6-7 years ago. Symptoms are intermittent. She has variable stool frequency, she has periods of constipation, and then loose stools, and then normal bowels for a few days and then the cycle repeats itself. On average 2 BMs per day. She has 3 days without a bowel movement, and then a few days of loose stools, and then normal bowel habits. She is routinely in a cycle of this. Her diet is fairly stable. She has previously tried to avoid dairy which she thinks has helped a little bit. She has had some blood in the stools, which she sees with constipation usually, and this is new for her since the last visit, perhaps it started within the past year or so. She can see blood noted every day in the stools for a few weeks at a time, and then it stops. Bright red blood, in both the stools and toilet paper. She thinks her wieght has been stable. She has a lot of gas and bloating. Bloating bothers her significantly. She thinks having a bowel movement can help some of these smyptoms but not always. She previously had some lower abdominal pains which had bothered her, which she does not have recently or currently. She has been given bentyl which she has not taken yet. She thinks stress plays a role at times, and she has a stressful job. She otherwise has sharp pains in her rectum which can occur sporadically without clear triggers. No related to bowel movements or seeing blood in the stools.   She denies a FH celiac, no Crohns or colitis. No FH of colon cancer. No prior colonoscopy. No other OTC or NSAIDs.    Past Medical History  Diagnosis Date  . No pertinent past medical history   . H/O varicella   . AMA (advanced maternal age) multigravida 52+   . Irritable bowel syndrome   . Hyperlipemia      Past Surgical History  Procedure  Laterality Date  . Pilonidal cyst excision  2003  . Dilation and curettage of uterus      x 2  . Wisdom tooth extraction     Family History  Problem Relation Age of Onset  . Arthritis Mother   . Thrombophlebitis Mother   . Hypothyroidism Mother   . Heart disease Father   . Hypertension Father   . Thrombophlebitis Maternal Grandmother   . Diabetes Maternal Grandmother   . Diabetes Paternal Grandmother   . Heart disease Paternal Grandfather     blocked arteries   Social History  Substance Use Topics  . Smoking status: Former Smoker    Quit date: 03/15/2002  . Smokeless tobacco: Never Used  . Alcohol Use: Yes     Comment: occaisionally    Current Outpatient Prescriptions  Medication Sig Dispense Refill  . dicyclomine (BENTYL) 20 MG tablet One po q 12 hours (Patient not taking: Reported on 01/22/2016) 60 tablet 0   No current facility-administered medications for this visit.   No Known Allergies   Review of Systems: All systems reviewed and negative except where noted in HPI.   Lab Results  Component Value Date   WBC 6.8 05/30/2015   HGB 14.0 05/30/2015   HCT 41.9 05/30/2015   MCV 93.7 05/30/2015   PLT 210 05/30/2015    Lab Results  Component Value Date   ALT 11 05/30/2015   AST 14 05/30/2015   ALKPHOS 42 05/30/2015   BILITOT 0.4 05/30/2015     Physical Exam: BP 98/60 mmHg  Pulse 72  Ht 5\' 8"  (1.727 m)  Wt 144 lb (65.318 kg)  BMI 21.90 kg/m2  LMP 01/12/2016 Constitutional: Pleasant,well-developed, female in no acute distress. HEENT: Normocephalic and atraumatic. Conjunctivae are normal. No scleral icterus. Neck supple.  Cardiovascular: Normal rate, regular rhythm.  Pulmonary/chest: Effort normal and breath sounds normal. No wheezing, rales or rhonchi. Abdominal: Soft, nondistended, nontender. Bowel sounds active throughout. There are no masses palpable. No hepatomegaly. Extremities: no edema Lymphadenopathy: No cervical adenopathy noted. Neurological:  Alert and oriented to person place and time. Skin: Skin is warm and dry. No rashes noted. Psychiatric: Normal mood and affect. Behavior is normal.   ASSESSMENT AND PLAN: 40 y/o female with previously diagnosed mixed type IBS, presenting with ongoing symptoms, now with intermittent rectal bleeding. Prior labs have shown no anemia. While her symptoms could certainly represent IBS and bleeding due to hemorrhoids in the setting of constipation, given her symptoms have changed with ongoing rectal bleeding, I am recommending a colonoscopy to ensure no bleeding polyp / mass lesion, and ensure no evidence of IBD. I discussed the risks / benefits of colonoscopy with her and she wished to proceed following this discussion. In the interim, I recommended a low FODMOP diet to see if this may help her symptoms, and also given her significant bloating symptom, while she does not have diarrhea predominance, she may get some benefit from Rifaximin, and after a discussion of this she wished to try a 2 week course of it. I counseled her bentyl is safe to use and she can take it PRN for abdominal cramping. I will finally send labs for celiac serology to ensure negative given these symptoms. She agreed to proceed as outlined, we will await her course and results of testing.   Philo Cellar, MD [facility] Pager [phone]

## 2016-01-23 LAB — TISSUE TRANSGLUTAMINASE, IGA: TISSUE TRANSGLUTAMINASE AB, IGA: 1 U/mL (ref <4)

## 2016-01-24 ENCOUNTER — Encounter: Payer: Self-pay | Admitting: Gastroenterology

## 2016-01-28 ENCOUNTER — Telehealth: Payer: Self-pay | Admitting: Gastroenterology

## 2016-01-28 NOTE — Telephone Encounter (Signed)
Returned [facility] call... Answered questions for prior auth

## 2016-01-30 ENCOUNTER — Telehealth: Payer: Self-pay | Admitting: Gastroenterology

## 2016-01-31 NOTE — Telephone Encounter (Signed)
We are currently working on trying to get samples for this patient

## 2016-02-03 ENCOUNTER — Telehealth: Payer: Self-pay | Admitting: Gastroenterology

## 2016-02-03 NOTE — Telephone Encounter (Signed)
Dr Havery Moros, The patient must have tried and failed a Tricyclic Antidepressant for Dawn Mcmillan not sure I understand why they are wanting her to try an antidepressant for IBS first. Is there something else we can prescribe? Magda Paganini was working on getting her samples but we have not been successful because several other patients are ahead of her

## 2016-02-03 NOTE — Telephone Encounter (Signed)
Thanks. I'm not sure why they are requesting this first. We could consider a TCA but this takes weeks to provide benefit and I think the colonoscopy should be done prior to giving her this. Can you please let her know the insurance won't approve this and I will discuss options with her when I see her for the colonoscopy, which I believe is scheduled this week. Thanks

## 2016-02-04 NOTE — Telephone Encounter (Signed)
Perfect thanks so much!

## 2016-02-04 NOTE — Telephone Encounter (Signed)
Dr Havery Moros I have samples for the patient I got them this morning

## 2016-02-06 ENCOUNTER — Ambulatory Visit (AMBULATORY_SURGERY_CENTER): Payer: BLUE CROSS/BLUE SHIELD | Admitting: Gastroenterology

## 2016-02-06 ENCOUNTER — Encounter: Payer: Self-pay | Admitting: Gastroenterology

## 2016-02-06 VITALS — BP 98/57 | HR 58 | Temp 99.1°F | Resp 12 | Ht 68.0 in | Wt 144.0 lb

## 2016-02-06 DIAGNOSIS — K621 Rectal polyp: Secondary | ICD-10-CM | POA: Diagnosis not present

## 2016-02-06 DIAGNOSIS — D120 Benign neoplasm of cecum: Secondary | ICD-10-CM | POA: Diagnosis not present

## 2016-02-06 DIAGNOSIS — D129 Benign neoplasm of anus and anal canal: Secondary | ICD-10-CM

## 2016-02-06 DIAGNOSIS — K625 Hemorrhage of anus and rectum: Secondary | ICD-10-CM

## 2016-02-06 DIAGNOSIS — D128 Benign neoplasm of rectum: Secondary | ICD-10-CM

## 2016-02-06 DIAGNOSIS — D123 Benign neoplasm of transverse colon: Secondary | ICD-10-CM

## 2016-02-06 MED ORDER — NONFORMULARY OR COMPOUNDED ITEM: Status: DC

## 2016-02-06 MED ORDER — SODIUM CHLORIDE 0.9 % IV SOLN
500.0000 mL | INTRAVENOUS | Status: DC
Start: 2016-02-06 — End: 2016-02-06

## 2016-02-06 NOTE — Progress Notes (Signed)
Patient awakening,vss,report to rn 

## 2016-02-06 NOTE — Patient Instructions (Signed)
Discharge instructions given. Handouts on polyps and hemorrhoids. No aspirin,ibruprofen,naproxen, or other non-steroidal anti-inflammatory medications for 2 weeks. Resume previous medications. YOU HAD AN ENDOSCOPIC PROCEDURE TODAY AT [facility]:   Refer to the procedure report that was given to you for any specific questions about what was found during the examination.  If the procedure report does not answer your questions, please call your gastroenterologist to clarify.  If you requested that your care partner not be given the details of your procedure findings, then the procedure report has been included in a sealed envelope for you to review at your convenience later.  YOU SHOULD EXPECT: Some feelings of bloating in the abdomen. Passage of more gas than usual.  Walking can help get rid of the air that was put into your GI tract during the procedure and reduce the bloating. If you had a lower endoscopy (such as a colonoscopy or flexible sigmoidoscopy) you may notice spotting of blood in your stool or on the toilet paper. If you underwent a bowel prep for your procedure, you may not have a normal bowel movement for a few days.  Please Note:  You might notice some irritation and congestion in your nose or some drainage.  This is from the oxygen used during your procedure.  There is no need for concern and it should clear up in a day or so.  SYMPTOMS TO REPORT IMMEDIATELY:   Following lower endoscopy (colonoscopy or flexible sigmoidoscopy):  Excessive amounts of blood in the stool  Significant tenderness or worsening of abdominal pains  Swelling of the abdomen that is new, acute  Fever of 100F or higher   For urgent or emergent issues, a gastroenterologist can be reached at any hour by calling [phone].   DIET: Your first meal following the procedure should be a small meal and then it is ok to progress to your normal diet. Heavy or fried foods are harder to digest and  may make you feel nauseous or bloated.  Likewise, meals heavy in dairy and vegetables can increase bloating.  Drink plenty of fluids but you should avoid alcoholic beverages for 24 hours.  ACTIVITY:  You should plan to take it easy for the rest of today and you should NOT DRIVE or use heavy machinery until tomorrow (because of the sedation medicines used during the test).    FOLLOW UP: Our staff will call the number listed on your records the next business day following your procedure to check on you and address any questions or concerns that you may have regarding the information given to you following your procedure. If we do not reach you, we will leave a message.  However, if you are feeling well and you are not experiencing any problems, there is no need to return our call.  We will assume that you have returned to your regular daily activities without incident.  If any biopsies were taken you will be contacted by phone or by letter within the next 1-3 weeks.  Please call us at [phone] if you have not heard about the biopsies in 3 weeks.    SIGNATURES/CONFIDENTIALITY: You and/or your care partner have signed paperwork which will be entered into your electronic medical record.  These signatures attest to the fact that that the information above on your After Visit Summary has been reviewed and is understood.  Full responsibility of the confidentiality of this discharge information lies with you and/or your care-partner.

## 2016-02-06 NOTE — Op Note (Signed)
[facility] Patient Name: Dawn Mcmillan Procedure Date: 02/06/2016 1:22 PM MRN: [id number] Endoscopist: Remo Lipps P. Havery Moros , MD Age: 40 Referring MD:  Date of Birth: 1976/05/04 Gender: Female Procedure:                Colonoscopy Indications:              Hematochezia, change in bowel habits. First time                            colonoscopy Medicines:                Monitored Anesthesia Care Procedure:                Pre-Anesthesia Assessment:                           - Prior to the procedure, a History and Physical                            was performed, and patient medications and                            allergies were reviewed. The patient's tolerance of                            previous anesthesia was also reviewed. The risks                            and benefits of the procedure and the sedation                            options and risks were discussed with the patient.                            All questions were answered, and informed consent                            was obtained. Prior Anticoagulants: The patient has                            taken no previous anticoagulant or antiplatelet                            agents. ASA Grade Assessment: II - A patient with                            mild systemic disease. After reviewing the risks                            and benefits, the patient was deemed in                            satisfactory condition to undergo the procedure.  After obtaining informed consent, the colonoscope                            was passed under direct vision. Throughout the                            procedure, the patient's blood pressure, pulse, and                            oxygen saturations were monitored continuously. The                            Model PCF-H190DL [id number]) scope was introduced                            through the anus and advanced to the the terminal                   ileum, with identification of the appendiceal                            orifice and IC valve. The colonoscopy was performed                            without difficulty. The patient tolerated the                            procedure well. The quality of the bowel                            preparation was good. The terminal ileum, ileocecal                            valve, appendiceal orifice, and rectum were                            photographed. Scope In: 1:34:14 PM Scope Out: 1:55:09 PM Scope Withdrawal Time: 0 hours 14 minutes 56 seconds  Total Procedure Duration: 0 hours 20 minutes 55 seconds  Findings:                 The perianal exam findings include a small anal                            fissure in the midline anterior canal.                           A 5 mm polyp was found in the cecum. The polyp was                            sessile. The polyp was removed with a cold snare.                            Resection and retrieval were complete.  A 4 mm polyp was found in the transverse colon. The                            polyp was sessile. The polyp was removed with a                            cold snare. Resection and retrieval were complete.                           A 3 mm polyp was found in the rectum. The polyp was                            sessile. The polyp was removed with a cold snare.                            Resection and retrieval were complete.                           Non-bleeding internal hemorrhoids were found during                            retroflexion. The hemorrhoids were small.                           The terminal ileum appeared normal.                           The exam was otherwise without abnormality. Complications:            No immediate complications. Estimated blood loss:                            Minimal. Estimated Blood Loss:     Estimated blood loss was minimal. Impression:               -  Anal fissure found on perianal exam.                           - One 5 mm polyp in the cecum, removed with a cold                            snare. Resected and retrieved.                           - One 4 mm polyp in the transverse colon, removed                            with a cold snare. Resected and retrieved.                           - One 3 mm polyp in the rectum, removed with a cold                            snare.  Resected and retrieved.                           - Non-bleeding internal hemorrhoids.                           - The examined portion of the ileum was normal.                           - The examination was otherwise normal.                           Overall suspect hemorrhoids / fissure are the cause                            of rectal bleeding. No evidence of inflammatory                            bowel disease. Recommendation:           - Patient has a contact number available for                            emergencies. The signs and symptoms of potential                            delayed complications were discussed with the                            patient. Return to normal activities tomorrow.                            Written discharge instructions were provided to the                            patient.                           - Resume previous diet.                           - Continue present medications Rifaximin for                            suspected IBS                           - No aspirin, ibuprofen, naproxen, or other                            non-steroidal anti-inflammatory drugs for 2 weeks                            after polyp removal.                           - Await pathology results.                           -  Repeat colonoscopy is recommended for                            surveillance if these polyps are pre-cancerous. The                            colonoscopy date will be determined after pathology                             results from today's exam become available for                            review. Remo Lipps P. Hjalmer Iovino, MD 02/06/2016 2:01:53 PM This report has been signed electronically.

## 2016-02-06 NOTE — Progress Notes (Signed)
Called to room to assist during endoscopic procedure.  Patient ID and intended procedure confirmed with present staff. Received instructions for my participation in the procedure from the performing physician.  

## 2016-02-07 ENCOUNTER — Telehealth: Payer: Self-pay | Admitting: *Deleted

## 2016-02-07 NOTE — Telephone Encounter (Signed)
  Follow up Call-  Call back number 02/06/2016  Post procedure Call Back phone  # [phone] cell  Permission to leave phone message Yes    [facility]

## 2016-02-10 ENCOUNTER — Encounter: Payer: Self-pay | Admitting: Gastroenterology

## 2016-06-05 ENCOUNTER — Encounter: Payer: Self-pay | Admitting: Internal Medicine

## 2016-06-05 ENCOUNTER — Ambulatory Visit (INDEPENDENT_AMBULATORY_CARE_PROVIDER_SITE_OTHER): Payer: BLUE CROSS/BLUE SHIELD | Admitting: Internal Medicine

## 2016-06-05 DIAGNOSIS — Z658 Other specified problems related to psychosocial circumstances: Secondary | ICD-10-CM

## 2016-06-05 DIAGNOSIS — G245 Blepharospasm: Secondary | ICD-10-CM

## 2016-06-05 DIAGNOSIS — F411 Generalized anxiety disorder: Secondary | ICD-10-CM | POA: Diagnosis not present

## 2016-06-05 DIAGNOSIS — H8110 Benign paroxysmal vertigo, unspecified ear: Secondary | ICD-10-CM

## 2016-06-05 DIAGNOSIS — F439 Reaction to severe stress, unspecified: Secondary | ICD-10-CM

## 2016-06-05 MED ORDER — ALPRAZOLAM 0.25 MG PO TABS: 0.2500 mg | ORAL_TABLET | Freq: Three times a day (TID) | ORAL | 0 refills | Status: DC | PRN

## 2016-06-19 NOTE — Progress Notes (Signed)
   Subjective:    Patient ID: Dawn Mcmillan, female    DOB: 1976-07-27, 40 y.o.   MRN: [id number]  HPI Patient is under a lot of stress with job that is not rewarding at the present time. Has difficult divorce. Is looking for another job. His had an eye twitch for 6 months and is concerned. Told her likely eye twitch is related to stress but perhaps she should consider seeing ophthalmologist.  She's had some dizziness. This also may be stress related or could be some benign positional vertigo.    Review of Systems see above     Objective:   Physical Exam Neurological exam: no focal deficits on brief neurological exam. PERRLA. Funduscopic exam is benign. TMs are clear. Neck is supple. Chest clear. Cardiac exam regular rate and rhythm normal S1 and S2.       Assessment & Plan:  Vertigo  Eye twitch  Situational stress  Anxiety  Plan: Xanax 0.25 mg up to 3 times daily as needed for anxiety #60 with no refill. May need to consider counseling.

## 2016-06-19 NOTE — Patient Instructions (Signed)
Xanax 0.25 mg up to 3 times daily as needed for vertigo and situational stress. Consider counseling. I twitch should resolve with stress improves. You may want to see ophthalmologist for another opinion regarding eye twitch.

## 2016-06-26 ENCOUNTER — Other Ambulatory Visit: Payer: Self-pay | Admitting: *Deleted

## 2016-06-26 ENCOUNTER — Telehealth: Payer: Self-pay | Admitting: Internal Medicine

## 2016-06-26 DIAGNOSIS — J069 Acute upper respiratory infection, unspecified: Secondary | ICD-10-CM

## 2016-06-26 MED ORDER — BENZONATATE 200 MG PO CAPS: 200.0000 mg | ORAL_CAPSULE | Freq: Three times a day (TID) | ORAL | 0 refills | Status: DC | PRN

## 2016-06-26 NOTE — Telephone Encounter (Signed)
Please prescribe Tessalon perles to this pharmacy 200 mg #60 one po tid prn cough

## 2016-06-26 NOTE — Telephone Encounter (Signed)
Calling with coughing/congestion, post-nasal drip, no fever.  States she has been taking Robitussin for the cough.  Feels she only needs something for the cough.  Wants to know if you would mind to call her in some the [place]?  States she has had them in the past and they seemed to help her tremendously.  States she didn't sleep much last night at all.      Pharmacy:  [facility] @ [id number] (they're closer to her house) although she NORMALLY uses [facility].    Best contact # for her:  [phone]

## 2016-06-26 NOTE — Telephone Encounter (Signed)
Done

## 2016-07-28 ENCOUNTER — Ambulatory Visit (INDEPENDENT_AMBULATORY_CARE_PROVIDER_SITE_OTHER): Payer: BLUE CROSS/BLUE SHIELD | Admitting: Internal Medicine

## 2016-07-28 DIAGNOSIS — Z23 Encounter for immunization: Secondary | ICD-10-CM | POA: Diagnosis not present

## 2016-09-01 ENCOUNTER — Other Ambulatory Visit: Payer: Self-pay | Admitting: Obstetrics and Gynecology

## 2016-09-01 DIAGNOSIS — N63 Unspecified lump in unspecified breast: Secondary | ICD-10-CM

## 2016-09-01 DIAGNOSIS — N644 Mastodynia: Secondary | ICD-10-CM

## 2016-09-17 ENCOUNTER — Ambulatory Visit
Admission: RE | Admit: 2016-09-17 | Discharge: 2016-09-17 | Disposition: A | Discharge status: Home / Self Care | Payer: BLUE CROSS/BLUE SHIELD | Source: Ambulatory Visit | Attending: Obstetrics and Gynecology | Admitting: Obstetrics and Gynecology

## 2016-09-17 DIAGNOSIS — N63 Unspecified lump in unspecified breast: Secondary | ICD-10-CM

## 2016-09-17 DIAGNOSIS — N644 Mastodynia: Secondary | ICD-10-CM

## 2016-09-17 DIAGNOSIS — R922 Inconclusive mammogram: Secondary | ICD-10-CM | POA: Diagnosis not present

## 2016-09-17 DIAGNOSIS — N6011 Diffuse cystic mastopathy of right breast: Secondary | ICD-10-CM | POA: Diagnosis not present

## 2016-10-05 ENCOUNTER — Other Ambulatory Visit: Payer: BLUE CROSS/BLUE SHIELD | Admitting: Internal Medicine

## 2016-10-06 ENCOUNTER — Other Ambulatory Visit: Payer: BLUE CROSS/BLUE SHIELD | Admitting: Internal Medicine

## 2016-10-06 DIAGNOSIS — R5383 Other fatigue: Secondary | ICD-10-CM | POA: Diagnosis not present

## 2016-10-06 DIAGNOSIS — Z0000 Encounter for general adult medical examination without abnormal findings: Secondary | ICD-10-CM | POA: Diagnosis not present

## 2016-10-06 LAB — COMPREHENSIVE METABOLIC PANEL
ALT: 13 U/L (ref 6–29)
AST: 15 U/L (ref 10–30)
Albumin: 4.1 g/dL (ref 3.6–5.1)
Alkaline Phosphatase: 38 U/L (ref 33–115)
BUN: 11 mg/dL (ref 7–25)
CHLORIDE: 104 mmol/L (ref 98–110)
CO2: 25 mmol/L (ref 20–31)
CREATININE: 1.01 mg/dL (ref 0.50–1.10)
Calcium: 8.8 mg/dL (ref 8.6–10.2)
Glucose, Bld: 81 mg/dL (ref 65–99)
POTASSIUM: 4.1 mmol/L (ref 3.5–5.3)
SODIUM: 138 mmol/L (ref 135–146)
Total Bilirubin: 0.5 mg/dL (ref 0.2–1.2)
Total Protein: 6.6 g/dL (ref 6.1–8.1)

## 2016-10-06 LAB — LIPID PANEL
CHOL/HDL RATIO: 2.9 ratio (ref <5.0)
CHOLESTEROL: 175 mg/dL (ref <200)
HDL: 60 mg/dL (ref >50)
LDL Cholesterol: 99 mg/dL (ref <100)
TRIGLYCERIDES: 78 mg/dL (ref <150)
VLDL: 16 mg/dL (ref <30)

## 2016-10-06 LAB — TSH: TSH: 1.07 mIU/L

## 2016-10-06 NOTE — Progress Notes (Signed)
Labs only

## 2016-10-07 LAB — CBC WITH DIFFERENTIAL/PLATELET
BASOS ABS: 0 {cells}/uL (ref 0–200)
Basophils Relative: 0 %
EOS ABS: 134 {cells}/uL (ref 15–500)
EOS PCT: 2 %
HCT: 43.4 % (ref 35.0–45.0)
HEMOGLOBIN: 14.1 g/dL (ref 11.7–15.5)
Lymphocytes Relative: 28 %
Lymphs Abs: 1876 cells/uL (ref 850–3900)
MCH: 31.2 pg (ref 27.0–33.0)
MCHC: 32.5 g/dL (ref 32.0–36.0)
MCV: 96 fL (ref 80.0–100.0)
MONOS PCT: 5 %
MPV: 9.4 fL (ref 7.5–12.5)
Monocytes Absolute: 335 cells/uL (ref 200–950)
NEUTROS PCT: 65 %
Neutro Abs: 4355 cells/uL (ref 1500–7800)
PLATELETS: 231 10*3/uL (ref 140–400)
RBC: 4.52 MIL/uL (ref 3.80–5.10)
RDW: 12.9 % (ref 11.0–15.0)
WBC: 6.7 10*3/uL (ref 3.8–10.8)

## 2016-10-09 ENCOUNTER — Ambulatory Visit (INDEPENDENT_AMBULATORY_CARE_PROVIDER_SITE_OTHER): Payer: BLUE CROSS/BLUE SHIELD | Admitting: Internal Medicine

## 2016-10-09 ENCOUNTER — Encounter: Payer: Self-pay | Admitting: Internal Medicine

## 2016-10-09 VITALS — BP 110/70 | HR 72 | Ht 68.0 in | Wt 149.0 lb

## 2016-10-09 DIAGNOSIS — Z0000 Encounter for general adult medical examination without abnormal findings: Secondary | ICD-10-CM | POA: Diagnosis not present

## 2016-10-09 DIAGNOSIS — K582 Mixed irritable bowel syndrome: Secondary | ICD-10-CM | POA: Diagnosis not present

## 2016-10-09 DIAGNOSIS — F411 Generalized anxiety disorder: Secondary | ICD-10-CM

## 2016-10-09 LAB — POC URINALSYSI DIPSTICK (AUTOMATED)
LEUKOCYTES UA: NEGATIVE
PH UA: 7
Spec Grav, UA: 1.015
UROBILINOGEN UA: NEGATIVE

## 2016-10-09 NOTE — Progress Notes (Signed)
   Subjective:    Patient ID: Dawn Mcmillan, female    DOB: 1975/11/13, 41 y.o.   MRN: [id number]  HPI  41 year old Female for health maintenance exam and evaluation of medical issues. History of anxiety and irritable bowel syndrome. Issues in to be centered around stress at work. Says work situation is getting better. No longer requiring Xanax. Irritable bowel is still sometimes an issue but better.  Lab work reviewed with her is within normal limits.  Her general health is good.  No history of serious illnesses accidents or operations.  Family history: Parents with history of hyperlipidemia. Maternal grandmother with history of dementia. Older brother with hyperlipidemia. Father with history of detached retina and hip replacements.  Social history: Married with 2 children. She said she had 3 miscarriages before having a successful pregnancy. She works full-time and travels. Exercises regularly.  Had  colonoscopy May 2017 for hematochezia. She was found to have an anal fissure and nonbleeding internal hemorrhoids. She also had 3 small polyps removed. This included tubular adenomas and hyperplastic polyp.    Review of Systems  Constitutional: Negative.   All other systems reviewed and are negative.      Objective:   Physical Exam  Constitutional: She is oriented to person, place, and time. She appears well-developed and well-nourished. No distress.  HENT:  Head: Normocephalic and atraumatic.  Right Ear: External ear normal.  Left Ear: External ear normal.  Mouth/Throat: Oropharynx is clear and moist.  Eyes: Conjunctivae and EOM are normal. Pupils are equal, round, and reactive to light. Right eye exhibits no discharge. Left eye exhibits no discharge. No scleral icterus.  Neck: Neck supple. No JVD present. No thyromegaly present.  Cardiovascular: Normal rate, regular rhythm, normal heart sounds and intact distal pulses.   No murmur heard. Pulmonary/Chest: Effort normal and  breath sounds normal. She has no wheezes. She has no rales.  Breasts normal female without masses  Abdominal: Soft. Bowel sounds are normal. She exhibits no distension and no mass. There is no tenderness. There is no rebound and no guarding.  Genitourinary:  Genitourinary Comments: Deferred to GYN  Musculoskeletal: She exhibits no edema.  Lymphadenopathy:    She has no cervical adenopathy.  Neurological: She is alert and oriented to person, place, and time. She has normal reflexes. No cranial nerve deficit. Coordination normal.  Skin: Skin is warm and dry. No rash noted. She is not diaphoretic.  Psychiatric: She has a normal mood and affect. Her behavior is normal. Judgment and thought content normal.  Vitals reviewed.         Assessment & Plan:  History of anxiety-stable  History of irritable bowel syndrome-stable and improved  History of hemorrhoids-currently not a problem  Plan: Medical issues are stable and under good control. Return in 1-2 years or as needed.

## 2016-10-18 NOTE — Patient Instructions (Addendum)
It was pleasure to see you today. Lab work is within normal limits. Return in 1-2 years or as needed.

## 2017-01-05 ENCOUNTER — Other Ambulatory Visit: Payer: Self-pay | Admitting: Obstetrics and Gynecology

## 2017-01-05 DIAGNOSIS — Z124 Encounter for screening for malignant neoplasm of cervix: Secondary | ICD-10-CM | POA: Diagnosis not present

## 2017-01-05 DIAGNOSIS — Z800 Family history of malignant neoplasm of digestive organs: Secondary | ICD-10-CM | POA: Insufficient documentation

## 2017-01-05 DIAGNOSIS — Z6822 Body mass index (BMI) 22.0-22.9, adult: Secondary | ICD-10-CM | POA: Diagnosis not present

## 2017-01-05 DIAGNOSIS — Z01419 Encounter for gynecological examination (general) (routine) without abnormal findings: Secondary | ICD-10-CM | POA: Diagnosis not present

## 2017-01-07 LAB — CYTOLOGY - PAP

## 2017-02-04 DIAGNOSIS — H6503 Acute serous otitis media, bilateral: Secondary | ICD-10-CM | POA: Diagnosis not present

## 2017-02-23 DIAGNOSIS — L814 Other melanin hyperpigmentation: Secondary | ICD-10-CM | POA: Diagnosis not present

## 2017-02-23 DIAGNOSIS — D225 Melanocytic nevi of trunk: Secondary | ICD-10-CM | POA: Diagnosis not present

## 2017-05-18 DIAGNOSIS — H9203 Otalgia, bilateral: Secondary | ICD-10-CM | POA: Diagnosis not present

## 2017-05-21 IMAGING — MG 2D DIGITAL DIAGNOSTIC BILATERAL MAMMOGRAM WITH CAD AND ADJUNCT T
8 of 15 series · 8 of 35 positions shown · non-contrast
Comparison: Previous examinations the most recent of which is dated
08/13/2015.

CLINICAL DATA: Re-evaluation of probably benign right breast
finding. This completes a 2 year followup evaluation of the right
breast probably benign finding. Also, the patient notes pain within
the subareolar left breast.

EXAM:
2D DIGITAL DIAGNOSTIC BILATERAL MAMMOGRAM WITH CAD AND ADJUNCT TOMO
ULTRASOUND BILATERAL BREAST

[R CC]
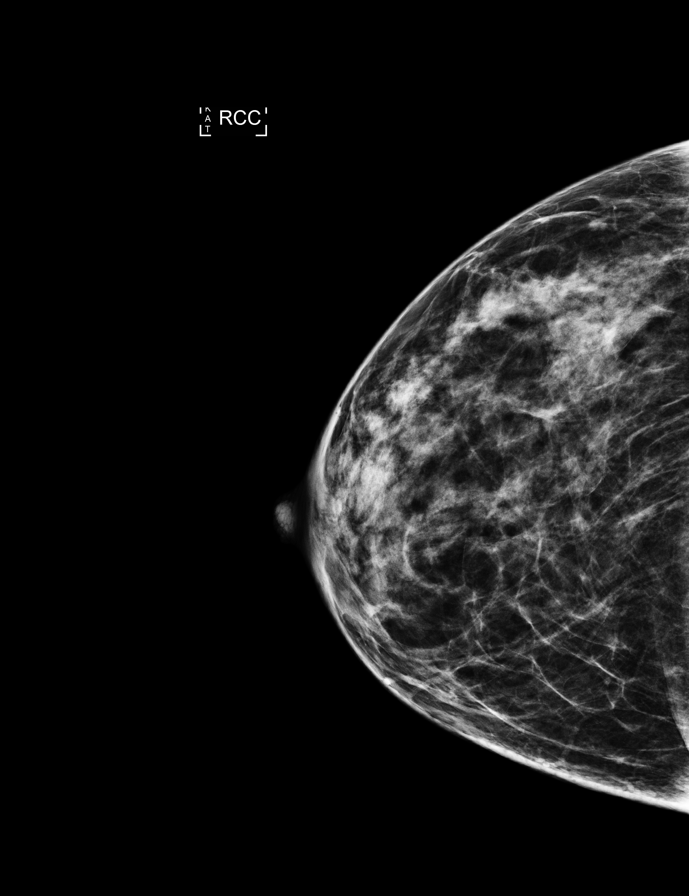

[R MLO]
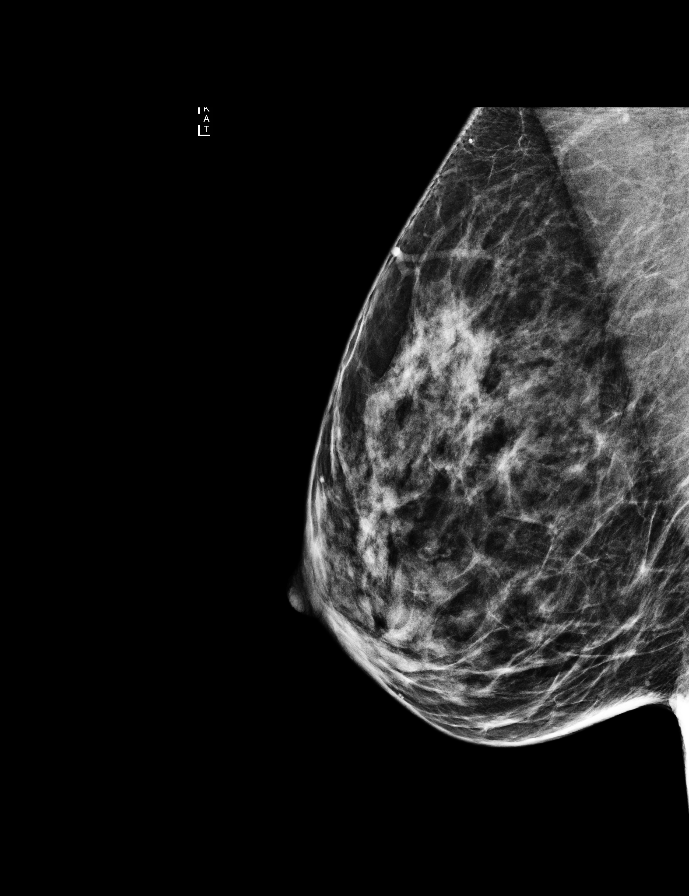

[L TAN synth-2D]
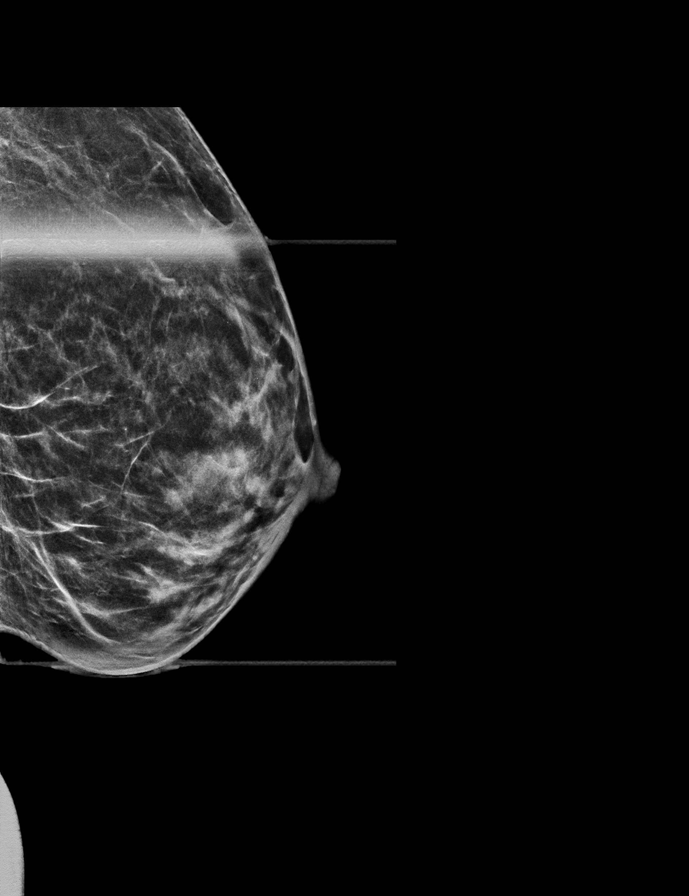

[L CC synth-2D]
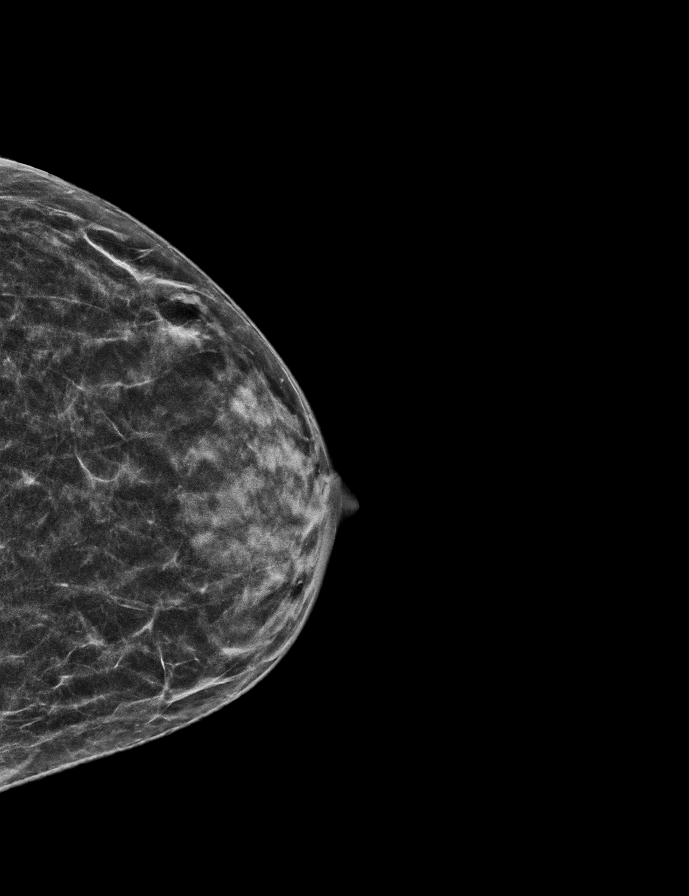

[R MLO synth-2D]
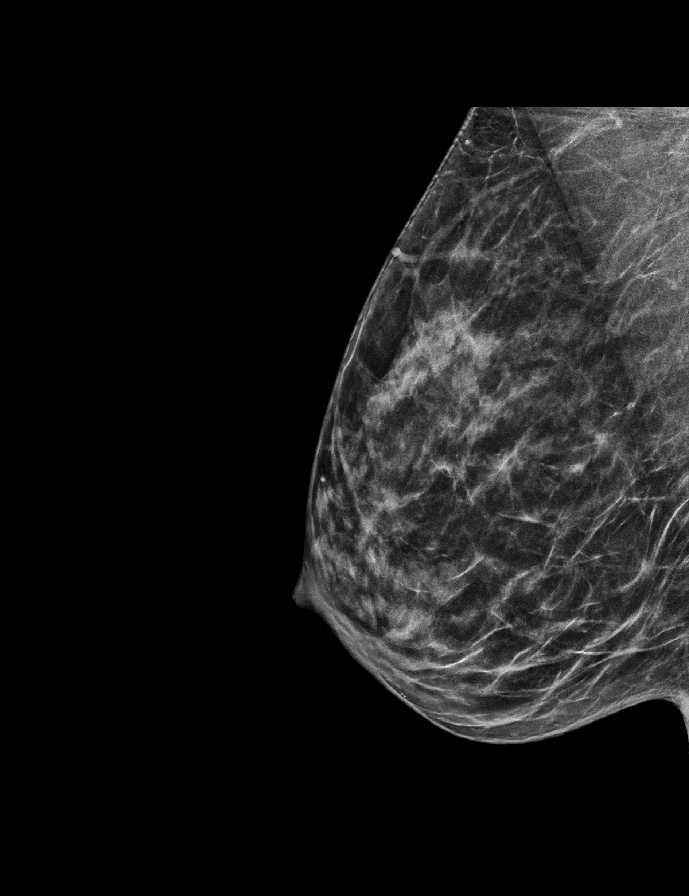

[L TAN]
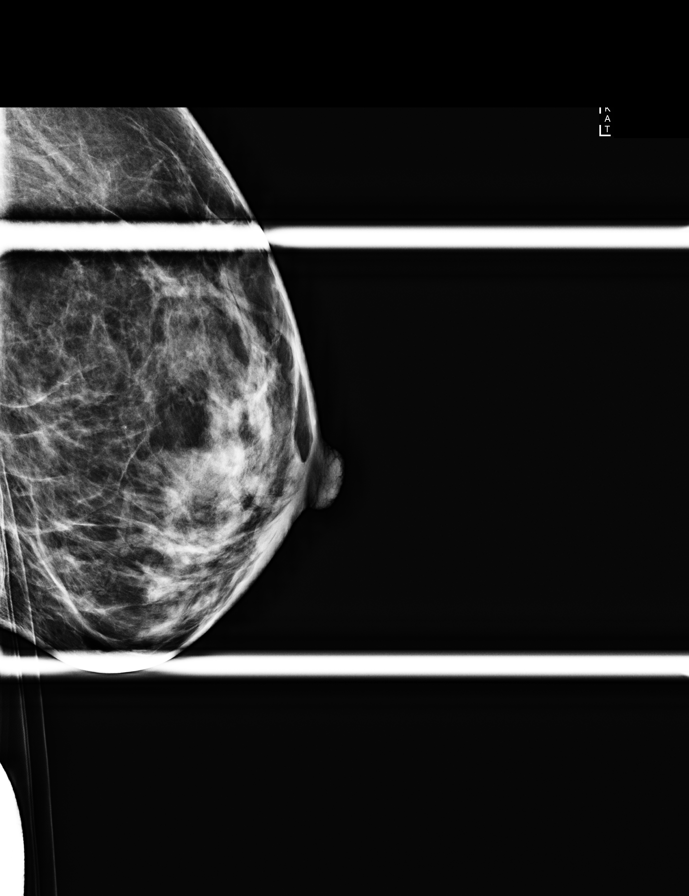

[L MLO synth-2D]
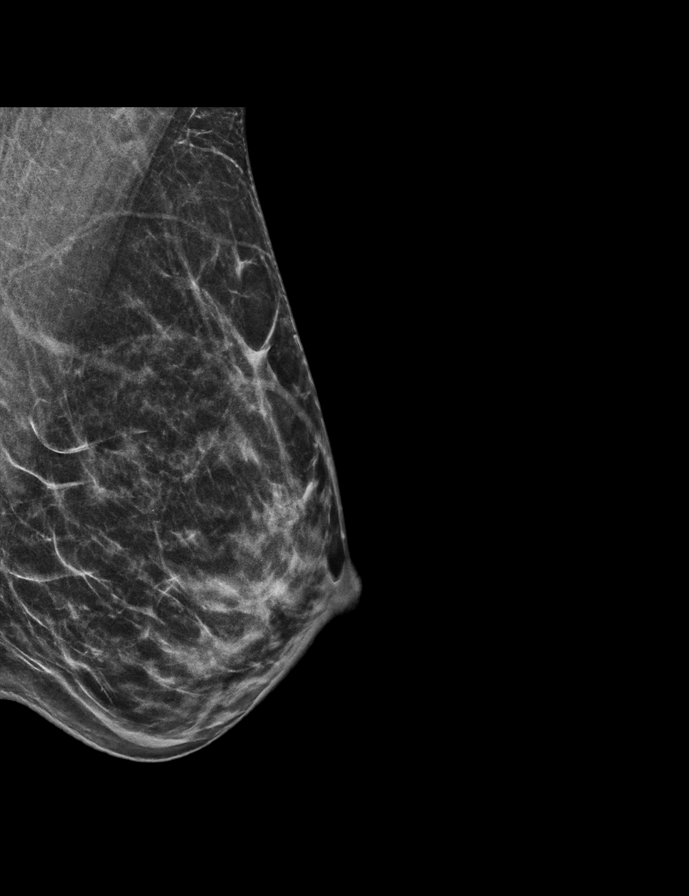

[L MLO]
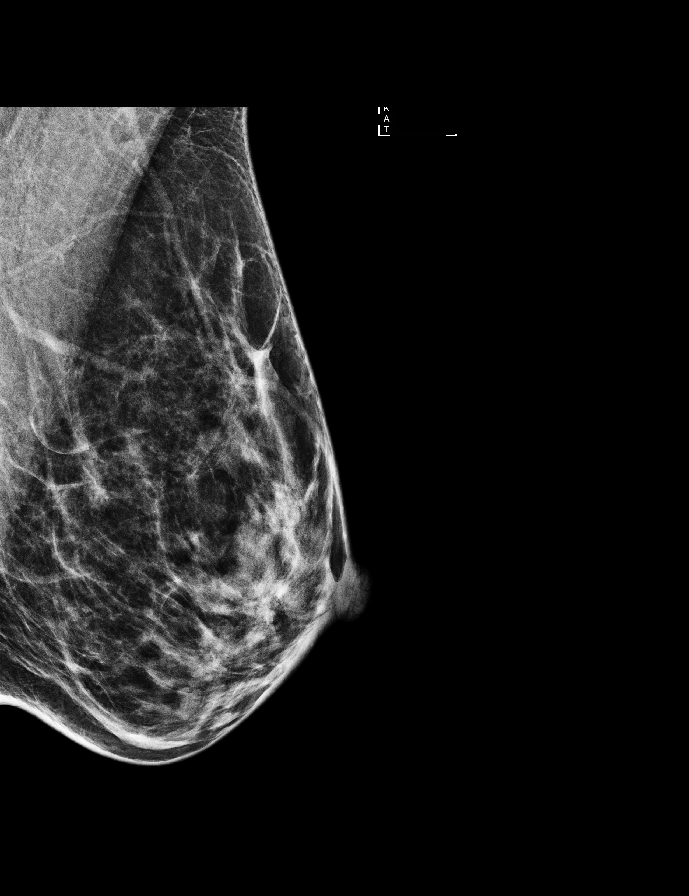

[8 of 35 positions shown; findings below may reference images not displayed]

ACR Breast Density Category c: The breast tissue is heterogeneously
dense, which may obscure small masses.
FINDINGS: The breast parenchymal pattern is stable. There is no worrisome
mass, distortion, or worrisome calcification within either breast.

Mammographic images were processed with CAD.

On physical exam, there is no discrete palpable abnormality within
the upper-outer quadrant of the right breast or subareolar left
breast in the area of the patient's pain.

Targeted ultrasound is performed, showing a circumscribed, oval,
parallel multi cystic lesion located within the right breast at the
10:30 o'clock position 5 cm from the nipple measuring 7 x 8 x 4 mm
in size and stable when compared with prior studies dating back for
greater than 2 years. This is consistent with a benign stable
finding.

Ultrasound of the subareolar left breast demonstrates normal
contents and no evidence for mass, cyst, distortion, or worrisome
shadowing. Most likely the patient's left breast pain is on a
hormonal basis.
IMPRESSION: Stable right breast lesion located at the 10:30 o'clock position 5
cm from the nipple. This has now been stable for greater than 2
years consistent with a benign finding. No findings worrisome for
malignancy. Most likely the patient's left breast pain is on a
hormonal basis.

RECOMMENDATION:
Bilateral screening mammography in 1 year.

I have discussed the findings and recommendations with the patient.
Results were also provided in writing at the conclusion of the
visit. If applicable, a reminder letter will be sent to the patient
regarding the next appointment.

BI-RADS CATEGORY  2: Benign.

## 2017-06-25 ENCOUNTER — Ambulatory Visit (INDEPENDENT_AMBULATORY_CARE_PROVIDER_SITE_OTHER): Payer: 59 | Admitting: Internal Medicine

## 2017-06-25 DIAGNOSIS — Z23 Encounter for immunization: Secondary | ICD-10-CM

## 2017-06-25 NOTE — Progress Notes (Signed)
   Subjective:    Patient ID: Dawn Mcmillan, female    DOB: Oct 05, 1975, 41 y.o.   MRN: [id number]  HPI flu vaccine given    Review of Systems     Objective:   Physical Exam        Assessment & Plan:

## 2017-06-25 NOTE — Patient Instructions (Signed)
Flu vaccine given.

## 2017-12-03 DIAGNOSIS — R42 Dizziness and giddiness: Secondary | ICD-10-CM | POA: Diagnosis not present

## 2017-12-29 ENCOUNTER — Other Ambulatory Visit: Payer: Self-pay

## 2017-12-29 DIAGNOSIS — E559 Vitamin D deficiency, unspecified: Secondary | ICD-10-CM

## 2017-12-29 DIAGNOSIS — Z0000 Encounter for general adult medical examination without abnormal findings: Secondary | ICD-10-CM

## 2017-12-29 DIAGNOSIS — Z13220 Encounter for screening for lipoid disorders: Secondary | ICD-10-CM

## 2017-12-29 DIAGNOSIS — Z1329 Encounter for screening for other suspected endocrine disorder: Secondary | ICD-10-CM

## 2018-01-17 DIAGNOSIS — Z01419 Encounter for gynecological examination (general) (routine) without abnormal findings: Secondary | ICD-10-CM | POA: Diagnosis not present

## 2018-01-17 DIAGNOSIS — Z6822 Body mass index (BMI) 22.0-22.9, adult: Secondary | ICD-10-CM | POA: Diagnosis not present

## 2018-01-17 DIAGNOSIS — Z124 Encounter for screening for malignant neoplasm of cervix: Secondary | ICD-10-CM | POA: Diagnosis not present

## 2018-01-17 DIAGNOSIS — F419 Anxiety disorder, unspecified: Secondary | ICD-10-CM | POA: Insufficient documentation

## 2018-01-17 DIAGNOSIS — Z1231 Encounter for screening mammogram for malignant neoplasm of breast: Secondary | ICD-10-CM | POA: Diagnosis not present

## 2018-01-18 ENCOUNTER — Other Ambulatory Visit: Payer: BLUE CROSS/BLUE SHIELD | Admitting: Internal Medicine

## 2018-01-18 DIAGNOSIS — Z1329 Encounter for screening for other suspected endocrine disorder: Secondary | ICD-10-CM | POA: Diagnosis not present

## 2018-01-18 DIAGNOSIS — E559 Vitamin D deficiency, unspecified: Secondary | ICD-10-CM | POA: Diagnosis not present

## 2018-01-18 DIAGNOSIS — Z0000 Encounter for general adult medical examination without abnormal findings: Secondary | ICD-10-CM

## 2018-01-18 DIAGNOSIS — Z13220 Encounter for screening for lipoid disorders: Secondary | ICD-10-CM | POA: Diagnosis not present

## 2018-01-18 LAB — COMPLETE METABOLIC PANEL WITH GFR
AG Ratio: 1.8 (calc) (ref 1.0–2.5)
ALBUMIN MSPROF: 4.4 g/dL (ref 3.6–5.1)
ALT: 13 U/L (ref 6–29)
AST: 16 U/L (ref 10–30)
Alkaline phosphatase (APISO): 47 U/L (ref 33–115)
BUN: 12 mg/dL (ref 7–25)
CALCIUM: 9.3 mg/dL (ref 8.6–10.2)
CO2: 25 mmol/L (ref 20–32)
CREATININE: 0.99 mg/dL (ref 0.50–1.10)
Chloride: 104 mmol/L (ref 98–110)
GFR, EST AFRICAN AMERICAN: 82 mL/min/{1.73_m2} (ref >=60)
GFR, Est Non African American: 71 mL/min/{1.73_m2} (ref >=60)
GLUCOSE: 89 mg/dL (ref 65–99)
Globulin: 2.5 g/dL (calc) (ref 1.9–3.7)
Potassium: 4.2 mmol/L (ref 3.5–5.3)
Sodium: 139 mmol/L (ref 135–146)
TOTAL PROTEIN: 6.9 g/dL (ref 6.1–8.1)
Total Bilirubin: 0.6 mg/dL (ref 0.2–1.2)

## 2018-01-18 LAB — LIPID PANEL
Cholesterol: 215 mg/dL — ABNORMAL HIGH (ref <200)
HDL: 66 mg/dL (ref >50)
LDL Cholesterol (Calc): 126 mg/dL (calc) — ABNORMAL HIGH
Non-HDL Cholesterol (Calc): 149 mg/dL (calc) — ABNORMAL HIGH (ref <130)
Total CHOL/HDL Ratio: 3.3 (calc) (ref <5.0)
Triglycerides: 123 mg/dL (ref <150)

## 2018-01-18 LAB — CBC WITH DIFFERENTIAL/PLATELET
BASOS ABS: 20 {cells}/uL (ref 0–200)
Basophils Relative: 0.3 %
Eosinophils Absolute: 79 cells/uL (ref 15–500)
Eosinophils Relative: 1.2 %
HEMATOCRIT: 39.6 % (ref 35.0–45.0)
HEMOGLOBIN: 13.5 g/dL (ref 11.7–15.5)
LYMPHS ABS: 1749 {cells}/uL (ref 850–3900)
MCH: 31.8 pg (ref 27.0–33.0)
MCHC: 34.1 g/dL (ref 32.0–36.0)
MCV: 93.2 fL (ref 80.0–100.0)
MPV: 10.3 fL (ref 7.5–12.5)
Monocytes Relative: 5.8 %
NEUTROS ABS: 4369 {cells}/uL (ref 1500–7800)
NEUTROS PCT: 66.2 %
Platelets: 216 10*3/uL (ref 140–400)
RBC: 4.25 10*6/uL (ref 3.80–5.10)
RDW: 12 % (ref 11.0–15.0)
Total Lymphocyte: 26.5 %
WBC: 6.6 10*3/uL (ref 3.8–10.8)
WBCMIX: 383 {cells}/uL (ref 200–950)

## 2018-01-18 LAB — TSH: TSH: 1.18 m[IU]/L

## 2018-01-19 LAB — VITAMIN D 25 HYDROXY (VIT D DEFICIENCY, FRACTURES): VIT D 25 HYDROXY: 27 ng/mL — AB (ref 30–100)

## 2018-01-21 ENCOUNTER — Ambulatory Visit (INDEPENDENT_AMBULATORY_CARE_PROVIDER_SITE_OTHER): Payer: BLUE CROSS/BLUE SHIELD | Admitting: Internal Medicine

## 2018-01-21 ENCOUNTER — Encounter: Payer: Self-pay | Admitting: Internal Medicine

## 2018-01-21 VITALS — BP 108/70 | HR 82 | Ht 68.0 in | Wt 146.0 lb

## 2018-01-21 DIAGNOSIS — Z8719 Personal history of other diseases of the digestive system: Secondary | ICD-10-CM | POA: Diagnosis not present

## 2018-01-21 DIAGNOSIS — E559 Vitamin D deficiency, unspecified: Secondary | ICD-10-CM | POA: Diagnosis not present

## 2018-01-21 DIAGNOSIS — E7800 Pure hypercholesterolemia, unspecified: Secondary | ICD-10-CM | POA: Diagnosis not present

## 2018-01-21 DIAGNOSIS — Z0000 Encounter for general adult medical examination without abnormal findings: Secondary | ICD-10-CM

## 2018-01-21 LAB — POCT URINALYSIS DIPSTICK
Appearance: NORMAL
BILIRUBIN UA: NEGATIVE
Blood, UA: NEGATIVE
GLUCOSE UA: NEGATIVE
KETONES UA: NEGATIVE
Leukocytes, UA: NEGATIVE
Nitrite, UA: NEGATIVE
ODOR: NORMAL
PROTEIN UA: NEGATIVE
Spec Grav, UA: 1.01 (ref 1.010–1.025)
Urobilinogen, UA: 0.2 E.U./dL
pH, UA: 7.5 (ref 5.0–8.0)

## 2018-02-13 NOTE — Progress Notes (Signed)
   Subjective:    Patient ID: Dawn Mcmillan, female    DOB: Jun 12, 1976, 42 y.o.   MRN: [id number]  HPI P is is pleasant 42 year old female in today for health maintenance exam.  General health is excellent.   No history of serious illnesses accidents or operations.  History of anxiety well syndrome stress at work but symptoms have improved.  No longer requires Xanax.  Social history: Married with 2 children.  She had 3 miscarriages before having a successful pregnancy.  She works full-time and travels.  Exercises regularly.  Family history: Parents with history of hyperlipidemia.  Maternal grandmother with history of dementia.  Older brother with hyperlipidemia.  Father with history of detached retina hip replacements.  She had colonoscopy May 2017 for hematochezia.  She was found to have an anal fissure and nonbleeding internal hemorrhoids.  She also had 3 small polyps removed.  This included tubular adenomas and a hyperplastic polyp.    Review of Systems  Constitutional: Negative.   All other systems reviewed and are negative.      Objective:   Physical Exam  Constitutional: She is oriented to person, place, and time. She appears well-developed and well-nourished.  HENT:  Head: Normocephalic and atraumatic.  Right Ear: External ear normal.  Left Ear: External ear normal.  Nose: Nose normal.  Mouth/Throat: Oropharynx is clear and moist.  Eyes: Pupils are equal, round, and reactive to light. EOM are normal. Right eye exhibits no discharge. Left eye exhibits no discharge.  Neck: Neck supple. No JVD present. No thyromegaly present.  Cardiovascular: Normal rate, regular rhythm, normal heart sounds and intact distal pulses.  No murmur heard. Pulmonary/Chest: Effort normal and breath sounds normal. No stridor. No respiratory distress. She has no wheezes. She has no rales.  Abdominal: Soft. She exhibits no distension and no mass. There is no tenderness. There is no guarding.    Genitourinary:  Genitourinary Comments: Deferred to GYN  Musculoskeletal: She exhibits no edema.  Lymphadenopathy:    She has no cervical adenopathy.  Neurological: She is oriented to person, place, and time. She displays normal reflexes. No cranial nerve deficit or sensory deficit. She exhibits normal muscle tone. Coordination normal.  Psychiatric: She has a normal mood and affect. Her behavior is normal. Judgment and thought content normal.          Assessment & Plan:  History of irritable bowel syndrome-improved  Normal health maintenance exam  History of vitamin D deficiency.  Level was low at 27-recommend 2000 units vitamin D3 daily  Elevated LDL cholesterol of 126 and previously was 99 a year ago.  She will work on diet and exercise.  Plan: Return in 1 year or as needed.

## 2018-02-15 NOTE — Patient Instructions (Signed)
It was a pleasure to see you today.  Take 2000 units vitamin D3 daily.  Watch diet.  Recheck lipid panel in 1 year.

## 2018-02-25 ENCOUNTER — Telehealth: Payer: Self-pay | Admitting: Internal Medicine

## 2018-02-25 ENCOUNTER — Ambulatory Visit: Payer: BLUE CROSS/BLUE SHIELD | Admitting: Internal Medicine

## 2018-02-25 ENCOUNTER — Telehealth: Payer: Self-pay

## 2018-02-25 ENCOUNTER — Encounter: Payer: Self-pay | Admitting: Internal Medicine

## 2018-02-25 VITALS — BP 102/72 | HR 73 | Temp 97.9°F | Ht 68.0 in | Wt 145.0 lb

## 2018-02-25 DIAGNOSIS — T85898A Other specified complication of other internal prosthetic devices, implants and grafts, initial encounter: Secondary | ICD-10-CM

## 2018-02-25 NOTE — Telephone Encounter (Signed)
Yes we can see her but not sure how much we can do.

## 2018-02-25 NOTE — Patient Instructions (Signed)
We called Dr. Thornell Mule' office. They request I send a note and nurse will review and see if appt earlier than June 27 is warranted.

## 2018-02-25 NOTE — Telephone Encounter (Addendum)
Patient called states the ENT doctor put a tube in her ear and yesterday it started feeling "loose" and is having pain and thinks there might be some fluid. She called the ENT office to get an appointment but the doctor will be in surgery all day and he is the only doctor at that office and he cannot see her today he advised her to call her PCP to see if you can look at her ear. Please advise.

## 2018-02-25 NOTE — Progress Notes (Signed)
   Subjective:    Patient ID: Dawn Mcmillan, female    DOB: 01-13-1976, 42 y.o.   MRN: [id number]  HPI Small  Ventilation tube right ear placed by Dr. Thornell Mule a few months ago, Pt says it is feeling stopped up. Cannot hear a whistle when she holds her nose and blows nose.  No recent URI. Says she has larger tube in left ear that seems fine. Feels some pressure in left ear.    Review of Systems see above     Objective:   Physical Exam  Small tube right ear sitting about 2 oclock that appears to be blocked with proteinacous material. Left tube completely open. Both TMs have good light reflexes.      Assessment & Plan:  Probable blocked right ventilation tube  Plan: Pt has been given appt with Dr. Thornell Mule for June 27th but would like to be seen sooner. We will call his office and ask it that is possible. Add: Office requests we fax a note for nurse to review. This will be done today.

## 2018-02-25 NOTE — Telephone Encounter (Signed)
Patient called back and would like to come in at 4pm today for the appointment.  She will make arrangements for childcare.  She really wants to have someone look at her ear before the weekend.  Advised we will be happy to see her at 4pm.

## 2018-02-25 NOTE — Telephone Encounter (Signed)
Patient called this morning for appointment and was given appointment for 12:30 as a work-in appointment.  Patient called at 12:31 to say that she was just getting to school to pick up her son from school and she was going to be a little "late".  She wanted to know if Dr. Renold Genta would be able to see her if she was a "little late".  Stated she would be about 20 minutes late.  Would Dr. Renold Genta still be able to see her if she got here about 12:50.  Advised that we close for lunch at 1:00.  Offered the patient a 4:00 p.m. Appointment.  Patient still wanted to know if she got here at 12:50 if Dr. Renold Genta would be able to see her?  Asked Dr. Renold Genta and she stated that she would not be here at 12:50.  Advised patient that Dr. Renold Genta would only be able to see her at 12:30 or 4pm today.    Patient got upset and said that this was NOT going to work for her and she would just have to cancel the appointment altogether.  I said I understood and she hung up.    She was offered 2 appointment times today when she called in on the same day.  And, this is an ENT issue.  The ENT put tubes in this patient's ears and the ENT is in surgery all day today and the ENT's office told the patient to call our office.  Patient is complaining of a "loose" feeling in her ear and pain.  Patient should be following up in the ENT's office.

## 2018-03-17 DIAGNOSIS — H6983 Other specified disorders of Eustachian tube, bilateral: Secondary | ICD-10-CM | POA: Diagnosis not present

## 2018-03-25 DIAGNOSIS — J189 Pneumonia, unspecified organism: Secondary | ICD-10-CM | POA: Diagnosis not present

## 2018-04-28 DIAGNOSIS — H6983 Other specified disorders of Eustachian tube, bilateral: Secondary | ICD-10-CM | POA: Diagnosis not present

## 2018-05-23 DIAGNOSIS — R05 Cough: Secondary | ICD-10-CM | POA: Diagnosis not present

## 2018-06-15 DIAGNOSIS — Z23 Encounter for immunization: Secondary | ICD-10-CM | POA: Diagnosis not present

## 2018-07-26 DIAGNOSIS — D1721 Benign lipomatous neoplasm of skin and subcutaneous tissue of right arm: Secondary | ICD-10-CM | POA: Diagnosis not present

## 2018-07-26 DIAGNOSIS — L814 Other melanin hyperpigmentation: Secondary | ICD-10-CM | POA: Diagnosis not present

## 2018-07-26 DIAGNOSIS — D485 Neoplasm of uncertain behavior of skin: Secondary | ICD-10-CM | POA: Diagnosis not present

## 2018-07-26 DIAGNOSIS — B078 Other viral warts: Secondary | ICD-10-CM | POA: Diagnosis not present

## 2018-07-26 DIAGNOSIS — D2272 Melanocytic nevi of left lower limb, including hip: Secondary | ICD-10-CM | POA: Diagnosis not present

## 2018-07-26 DIAGNOSIS — D225 Melanocytic nevi of trunk: Secondary | ICD-10-CM | POA: Diagnosis not present

## 2018-07-26 DIAGNOSIS — L918 Other hypertrophic disorders of the skin: Secondary | ICD-10-CM | POA: Diagnosis not present

## 2018-07-26 DIAGNOSIS — C44519 Basal cell carcinoma of skin of other part of trunk: Secondary | ICD-10-CM | POA: Diagnosis not present

## 2018-08-09 DIAGNOSIS — C44519 Basal cell carcinoma of skin of other part of trunk: Secondary | ICD-10-CM | POA: Diagnosis not present

## 2018-09-02 ENCOUNTER — Ambulatory Visit: Payer: BLUE CROSS/BLUE SHIELD | Admitting: Gastroenterology

## 2018-09-02 ENCOUNTER — Encounter: Payer: Self-pay | Admitting: Gastroenterology

## 2018-09-02 VITALS — BP 112/78 | HR 68 | Ht 68.0 in | Wt 145.0 lb

## 2018-09-02 DIAGNOSIS — K582 Mixed irritable bowel syndrome: Secondary | ICD-10-CM | POA: Diagnosis not present

## 2018-09-02 DIAGNOSIS — K648 Other hemorrhoids: Secondary | ICD-10-CM

## 2018-09-02 NOTE — Patient Instructions (Addendum)
If you are age 41 or older, your body mass index should be between 23-30. Your Body mass index is 22.05 kg/m. If this is out of the aforementioned range listed, please consider follow up with your Primary Care Provider.  If you are age 20 or younger, your body mass index should be between 19-25. Your Body mass index is 22.05 kg/m. If this is out of the aformentioned range listed, please consider follow up with your Primary Care Provider.   Please purchase the following medications over the counter and take as directed: Citrucel: Take daily as directed  1% Hydrocortisone cream- use as needed for internal hemorrhoids  Thank you for entrusting me with your care and for choosing [facility], Dr. [place] Cellar

## 2018-09-02 NOTE — Progress Notes (Signed)
HPI :  42 year old female here for a follow-up visit. I first saw her in 2017 for bowel habit changes and rectal bleeding. After that visit she underwent a colonoscopy in May 2017. She was found to have 3 small polyps, 2 of which were adenomas, otherwise noted to have a small anal fissure and small internal hemorrhoids. At the time of the visit I had recommended a low FODMAP diet, trial of rifaximin, and Bentyl as needed. We also obtained labs for celiac disease which were negative.  She states the rifaximin did help the gas and bloating and cramps.  She did not notice much benefit from the Bentyl. Abdominal cramps and bloating has not been an issue for lately, doing okay in that regard. Main issue is intermittent constipation and then loose stools. Within a span of a week she will have a few days of constipation, normal have multiple loose stools for a day or 2 until the cycle starts again. She thinks ratio constipation to diarrhea is roughly 50-50. She states after her colonoscopy the bleeding had resolved, although over the past month she's had some recurrence. Reports a small amount of blood in the stool at times, also noted on the toilet paper. She does have some rectal discomfort with this at times. A friend of her around the same age recently passed away from stage IV colon cancer. No FH of colon cancer  Colonoscopy 02/06/2016 - small anal fissure, small hemorrhoids, 3 small polyps - nl otherwise, 2 adenomas - repeat in 5 years   Past Medical History:  Diagnosis Date  . Allergy   . AMA (advanced maternal age) multigravida 13+   . Anemia   . Depression   . GERD (gastroesophageal reflux disease)    during pregnancy  . H/O varicella   . Hyperlipemia   . Irritable bowel syndrome   . No pertinent past medical history      Past Surgical History:  Procedure Laterality Date  . DILATION AND CURETTAGE OF UTERUS     x 2  . PILONIDAL CYST EXCISION  2003  . WISDOM TOOTH EXTRACTION      Family History  Problem Relation Age of Onset  . Arthritis Mother   . Thrombophlebitis Mother   . Hypothyroidism Mother   . Colon polyps Mother   . Heart disease Father   . Hypertension Father   . Thrombophlebitis Maternal Grandmother   . Diabetes Maternal Grandmother   . Diabetes Paternal Grandmother   . Heart disease Paternal Grandfather        blocked arteries  . Colonic polyp Maternal Grandfather   . Colon cancer Neg Hx   . Esophageal cancer Neg Hx   . Pancreatic cancer Neg Hx   . Rectal cancer Neg Hx   . Stomach cancer Neg Hx    Social History   Tobacco Use  . Smoking status: Former Smoker    Last attempt to quit: 03/15/2002    Years since quitting: 16.4  . Smokeless tobacco: Never Used  Substance Use Topics  . Alcohol use: Yes    Comment: occaisionally   . Drug use: No   Current Outpatient Medications  Medication Sig Dispense Refill  . Nutritional Supplements (JUICE PLUS FIBRE PO) Take by mouth.     No current facility-administered medications for this visit.    No Known Allergies   Review of Systems: All systems reviewed and negative except where noted in HPI.   Lab Results  Component Value Date  WBC 6.6 01/18/2018   HGB 13.5 01/18/2018   HCT 39.6 01/18/2018   MCV 93.2 01/18/2018   PLT 216 01/18/2018    Lab Results  Component Value Date   CREATININE 0.99 01/18/2018   BUN 12 01/18/2018   NA 139 01/18/2018   K 4.2 01/18/2018   CL 104 01/18/2018   CO2 25 01/18/2018    Lab Results  Component Value Date   ALT 13 01/18/2018   AST 16 01/18/2018   ALKPHOS 38 10/06/2016   BILITOT 0.6 01/18/2018     Physical Exam: BP 112/78   Pulse 68   Ht 5\' 8"  (1.727 m)   Wt 145 lb (65.8 kg)   BMI 22.05 kg/m  Constitutional: Pleasant,well-developed,female in no acute distress. HEENT: Normocephalic and atraumatic. Conjunctivae are normal. No scleral icterus. Neck supple.  Cardiovascular: Normal rate, regular rhythm.  Pulmonary/chest: Effort normal  and breath sounds normal. No wheezing, rales or rhonchi. Abdominal: Soft, nondistended, nontender.  There are no masses palpable. No hepatomegaly. Anoscopy / DRE - standby Tia Alert CMA - no fissure, internal hemorrhoids noted which are mildly inflamed, no mass lesion Extremities: no edema Lymphadenopathy: No cervical adenopathy noted. Neurological: Alert and oriented to person place and time. Skin: Skin is warm and dry. No rashes noted. Psychiatric: Normal mood and affect. Behavior is normal.   ASSESSMENT AND PLAN: 42 year old female here for reassessment of the following issues:  IBS - mixed type / rectal bleeding / internal hemorrhoids - I think her alternating bowel habits are likely due to mixed type IBS. We discussed treatment options. Recommend a trial of a daily fiber supplement such as Citrucel to see if this will help regulate her bowel. Hopefully the Citrucel has less chance of causing bloating than other fiber supplements. If this is not successful may consider a trial of MiraLAX as she seems to feel better when stools are on the looser side. Bloating / cramping not bothering her too much now, Rifaximin appeared to help that previously in case she needs this in the future. On DRE and endoscopy, I don't see any obvious fissure today. I suspect her superficial bleeding is likely coming from internal hemorrhoids. Discussed options. Recommend trial of 1% topical hydrocortisone cream to be applied via a glycerin suppository. If this is not helpful may consider banding procedure. I reassured her that her last colonoscopy had a good preparation and no high risk lesions in the colon. She did have a few small adenomas that were noted and removed, she is due for a surveillance colonoscopy 5 years from her last exam, thus 2022. She agreed.  [place] Cellar, MD [facility]

## 2018-10-18 ENCOUNTER — Encounter: Payer: Self-pay | Admitting: Internal Medicine

## 2018-10-18 ENCOUNTER — Ambulatory Visit: Payer: BLUE CROSS/BLUE SHIELD | Admitting: Internal Medicine

## 2018-10-18 VITALS — BP 102/80 | HR 72 | Temp 98.6°F | Ht 68.0 in | Wt 144.0 lb

## 2018-10-18 DIAGNOSIS — R002 Palpitations: Secondary | ICD-10-CM | POA: Diagnosis not present

## 2018-10-18 DIAGNOSIS — H938X9 Other specified disorders of ear, unspecified ear: Secondary | ICD-10-CM | POA: Diagnosis not present

## 2018-10-18 DIAGNOSIS — F411 Generalized anxiety disorder: Secondary | ICD-10-CM

## 2018-10-18 DIAGNOSIS — R42 Dizziness and giddiness: Secondary | ICD-10-CM

## 2018-10-18 DIAGNOSIS — H6980 Other specified disorders of Eustachian tube, unspecified ear: Secondary | ICD-10-CM | POA: Diagnosis not present

## 2018-10-18 MED ORDER — ESCITALOPRAM OXALATE 10 MG PO TABS: 10.0000 mg | ORAL_TABLET | Freq: Every day | ORAL | 0 refills | Status: DC

## 2018-10-18 MED ORDER — AMOXICILLIN 500 MG PO CAPS: 500.0000 mg | ORAL_CAPSULE | Freq: Three times a day (TID) | ORAL | 0 refills | Status: DC

## 2018-10-18 MED ORDER — METHYLPREDNISOLONE ACETATE 80 MG/ML IJ SUSP
80.0000 mg | Freq: Once | INTRAMUSCULAR | Status: AC
  Administered 2018-10-18: 80 mg via INTRAMUSCULAR

## 2018-10-18 NOTE — Progress Notes (Signed)
   Subjective:    Patient ID: Dawn Mcmillan, female    DOB: March 25, 1976, 43 y.o.   MRN: [id number]  HPI 43 year old Female in today with complaint of ear pressure.  Has been traveling by airplane for work.  No recent respiratory infection that she is aware of.  She has had bilateral ventilation tubes placed by Dr. Thornell Mule and wonders if they are working properly.  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  In the meantime she is continuing to work full-time.  She frequently feels torn between work and her family.  Work is not been very rewarding recently.  She is been having some palpitations at night.  Sometimes she thinks her heart skips a beat.  We will plan to order a 24-hour Holter monitor.  She has a remote history of anorexia while in college.  At one point she took Lexapro 10 mg daily and that seemed to help anxiety issues.  She thinks she may have taken some Xanax but it seemed to cause sedation.  Admits to perfectionistic tendencies.  New boss is more supportive than previous losses.  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  Previously has taken Xanax for anxiety 0.25 mg up to 3 times daily as needed.    Review of Systems see above.  Denies chest pain or shortness of breath.     Objective:   Physical Exam Vital signs reviewed.  She is alert and oriented but slightly anxious.  Somewhat tearful when describing stresses with work and home life balance.  She has bilateral grommet tubes in place.  The TMs are not red.  Neck is supple without thyromegaly.  Chest clear.  Cardiac exam regular rate and rhythm without murmurs, clicks.       Assessment & Plan:  History of bilateral grommet tube placement by Dr. 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  She will try to get back into see him to have the  tubes checked.  It sounds like there has been some eustachian tube dysfunction/ventilation tube dysfunction with pressure and hearing heartbeat in her ear.  Given Depo-Medrol 80 mg IM and will be started on amoxicillin 500 mg 3 times a day for 10 days.  Palpitations-likely related to stress and anxiety.  Will order 24-hour Holter monitor.  EKG not done today.  Cardiac exam today is normal.  Anxiety state-related to job stress and patient having high expectations of herself.  We have elected to restart Lexapro 10 mg daily rather than refill Xanax.  She may benefit from counseling for anxiety.

## 2018-10-18 NOTE — Patient Instructions (Signed)
Amoxicillin 500 mg 3 times a day for 10 days.  Depo-Medrol 80 mg IM.  24-hour Holter monitor ordered.  Please see Dr. Marene Lenz for follow-up of ventilation tubes.  Start Lexapro 10 mg daily for anxiety.

## 2018-11-02 ENCOUNTER — Ambulatory Visit (INDEPENDENT_AMBULATORY_CARE_PROVIDER_SITE_OTHER): Payer: BLUE CROSS/BLUE SHIELD

## 2018-11-02 DIAGNOSIS — R002 Palpitations: Secondary | ICD-10-CM | POA: Diagnosis not present

## 2018-11-07 ENCOUNTER — Encounter: Payer: Self-pay | Admitting: Cardiovascular Disease

## 2018-12-01 NOTE — Progress Notes (Deleted)
No chief complaint on file.     History of Present Illness:43 yo female with history of GERD, irritable bowel syndrome and hyperlipidemia here today as a new consult, referred by Dr. Renold Genta for the evaluation of palpitations. Cardiac monitor with sinus rhythm with sinus bradycardia. No early beats were seen.   She tells me today that she ***  Primary Care Physician: Elby Showers, MD   Past Medical History:  Diagnosis Date  . Allergy   . AMA (advanced maternal age) multigravida 67+   . Anemia   . Depression   . GERD (gastroesophageal reflux disease)    during pregnancy  . H/O varicella   . Hyperlipemia   . Irritable bowel syndrome   . No pertinent past medical history     Past Surgical History:  Procedure Laterality Date  . DILATION AND CURETTAGE OF UTERUS     x 2  . PILONIDAL CYST EXCISION  2003  . WISDOM TOOTH EXTRACTION      Current Outpatient Medications  Medication Sig Dispense Refill  . amoxicillin (AMOXIL) 500 MG capsule Take 1 capsule (500 mg total) by mouth 3 (three) times daily. 30 capsule 0  . escitalopram (LEXAPRO) 10 MG tablet Take 1 tablet (10 mg total) by mouth daily. 30 tablet 0  . Nutritional Supplements (JUICE PLUS FIBRE PO) Take by mouth.     No current facility-administered medications for this visit.     No Known Allergies  Social History   Socioeconomic History  . Marital status: Married    Spouse name: Not on file  . Number of children: 2  . Years of education: Not on file  . Highest education level: Not on file  Occupational History  . Occupation: Radio producer: [employer]  . Financial resource strain: Not on file  . Food insecurity:    Worry: Not on file    Inability: Not on file  . Transportation needs:    Medical: Not on file    Non-medical: Not on file  Tobacco Use  . Smoking status: Former Smoker    Last attempt to quit: 03/15/2002    Years since quitting: 16.7  .  Smokeless tobacco: Never Used  Substance and Sexual Activity  . Alcohol use: Yes    Comment: occaisionally   . Drug use: No  . Sexual activity: Yes  Lifestyle  . Physical activity:    Days per week: Not on file    Minutes per session: Not on file  . Stress: Not on file  Relationships  . Social connections:    Talks on phone: Not on file    Gets together: Not on file    Attends religious service: Not on file    Active member of club or organization: Not on file    Attends meetings of clubs or organizations: Not on file    Relationship status: Not on file  . Intimate partner violence:    Fear of current or ex partner: Not on file    Emotionally abused: Not on file    Physically abused: Not on file    Forced sexual activity: Not on file  Other Topics Concern  . Not on file  Social History Narrative  . Not on file    Family History  Problem Relation Age of Onset  . Arthritis Mother   . Thrombophlebitis Mother   . Hypothyroidism Mother   . Colon polyps Mother   .  Heart disease Father   . Hypertension Father   . Thrombophlebitis Maternal Grandmother   . Diabetes Maternal Grandmother   . Diabetes Paternal Grandmother   . Heart disease Paternal Grandfather        blocked arteries  . Colonic polyp Maternal Grandfather   . Colon cancer Neg Hx   . Esophageal cancer Neg Hx   . Pancreatic cancer Neg Hx   . Rectal cancer Neg Hx   . Stomach cancer Neg Hx     Review of Systems:  As stated in the HPI and otherwise negative.   There were no vitals taken for this visit.  Physical Examination: General: Well developed, well nourished, NAD  HEENT: OP clear, mucus membranes moist  SKIN: warm, dry. No rashes. Neuro: No focal deficits  Musculoskeletal: Muscle strength 5/5 all ext  Psychiatric: Mood and affect normal  Neck: No JVD, no carotid bruits, no thyromegaly, no lymphadenopathy.  Lungs:Clear bilaterally, no wheezes, rhonci, crackles Cardiovascular: Regular rate and  rhythm. No murmurs, gallops or rubs. Abdomen:Soft. Bowel sounds present. Non-tender.  Extremities: No lower extremity edema. Pulses are 2 + in the bilateral DP/PT.  EKG:  EKG {ACTION; IS/IS OTL:[id number]} ordered today. The ekg ordered today demonstrates ***  Recent Labs: 01/18/2018: ALT 13; BUN 12; Creat 0.99; Hemoglobin 13.5; Platelets 216; Potassium 4.2; Sodium 139; TSH 1.18   Lipid Panel    Component Value Date/Time   CHOL 215 (H) 01/18/2018 0943   TRIG 123 01/18/2018 0943   HDL 66 01/18/2018 0943   CHOLHDL 3.3 01/18/2018 0943   VLDL 16 10/06/2016 1131   LDLCALC 126 (H) 01/18/2018 0943     Wt Readings from Last 3 Encounters:  10/18/18 144 lb (65.3 kg)  09/02/18 145 lb (65.8 kg)  02/25/18 145 lb (65.8 kg)     Other studies Reviewed: Additional studies/ records that were reviewed today include: ***. Review of the above records demonstrates: ***   Assessment and Plan:   1. Palpitations: ***  Current medicines are reviewed at length with the patient today.  The patient {ACTIONS; HAS/DOES NOT HAVE:19233} concerns regarding medicines.  The following changes have been made:  {PLAN; NO CHANGE:13088:s}  Labs/ tests ordered today include: *** No orders of the defined types were placed in this encounter.    Disposition:   FU with *** in {gen number 5-97:416384} {TIME; UNITS DAY/WEEK/MONTH:19136}   Signed, Lauree Chandler, MD 12/01/2018 11:59 AM    [facility] [address], [place], [place]  [address] Phone: [phone]; Fax: [phone]

## 2018-12-02 ENCOUNTER — Telehealth: Payer: Self-pay | Admitting: Cardiovascular Disease

## 2018-12-02 ENCOUNTER — Ambulatory Visit: Payer: BLUE CROSS/BLUE SHIELD | Admitting: Cardiovascular Disease

## 2018-12-02 NOTE — Telephone Encounter (Signed)
Pt did not show for her new cardiac appointment with me today. She can be rescheduled on the open schedule of any cardiology provider if she chooses.   Lauree Chandler 12/02/2018 9:16 AM

## 2018-12-05 ENCOUNTER — Encounter: Payer: Self-pay | Admitting: Cardiovascular Disease

## 2019-01-31 ENCOUNTER — Other Ambulatory Visit: Payer: Self-pay

## 2019-01-31 ENCOUNTER — Other Ambulatory Visit: Payer: BLUE CROSS/BLUE SHIELD | Admitting: Internal Medicine

## 2019-01-31 DIAGNOSIS — Z0000 Encounter for general adult medical examination without abnormal findings: Secondary | ICD-10-CM

## 2019-01-31 DIAGNOSIS — E7800 Pure hypercholesterolemia, unspecified: Secondary | ICD-10-CM

## 2019-01-31 DIAGNOSIS — Z8719 Personal history of other diseases of the digestive system: Secondary | ICD-10-CM

## 2019-01-31 DIAGNOSIS — E785 Hyperlipidemia, unspecified: Secondary | ICD-10-CM

## 2019-01-31 DIAGNOSIS — E559 Vitamin D deficiency, unspecified: Secondary | ICD-10-CM | POA: Diagnosis not present

## 2019-02-01 LAB — COMPLETE METABOLIC PANEL WITH GFR
AG Ratio: 1.7 (calc) (ref 1.0–2.5)
ALT: 13 U/L (ref 6–29)
AST: 16 U/L (ref 10–30)
Albumin: 4.3 g/dL (ref 3.6–5.1)
Alkaline phosphatase (APISO): 43 U/L (ref 31–125)
BUN: 13 mg/dL (ref 7–25)
CO2: 26 mmol/L (ref 20–32)
Calcium: 9.3 mg/dL (ref 8.6–10.2)
Chloride: 105 mmol/L (ref 98–110)
Creat: 0.85 mg/dL (ref 0.50–1.10)
GFR, Est African American: 98 mL/min/{1.73_m2} (ref >=60)
GFR, Est Non African American: 85 mL/min/{1.73_m2} (ref >=60)
Globulin: 2.5 g/dL (calc) (ref 1.9–3.7)
Glucose, Bld: 97 mg/dL (ref 65–99)
Potassium: 4.3 mmol/L (ref 3.5–5.3)
Sodium: 138 mmol/L (ref 135–146)
Total Bilirubin: 0.6 mg/dL (ref 0.2–1.2)
Total Protein: 6.8 g/dL (ref 6.1–8.1)

## 2019-02-01 LAB — CBC WITH DIFFERENTIAL/PLATELET
Absolute Monocytes: 377 cells/uL (ref 200–950)
Basophils Absolute: 33 cells/uL (ref 0–200)
Basophils Relative: 0.5 %
Eosinophils Absolute: 150 cells/uL (ref 15–500)
Eosinophils Relative: 2.3 %
HCT: 40.1 % (ref 35.0–45.0)
Hemoglobin: 13.7 g/dL (ref 11.7–15.5)
Lymphs Abs: 1411 cells/uL (ref 850–3900)
MCH: 32.6 pg (ref 27.0–33.0)
MCHC: 34.2 g/dL (ref 32.0–36.0)
MCV: 95.5 fL (ref 80.0–100.0)
MPV: 10.3 fL (ref 7.5–12.5)
Monocytes Relative: 5.8 %
Neutro Abs: 4531 cells/uL (ref 1500–7800)
Neutrophils Relative %: 69.7 %
Platelets: 234 10*3/uL (ref 140–400)
RBC: 4.2 10*6/uL (ref 3.80–5.10)
RDW: 12 % (ref 11.0–15.0)
Total Lymphocyte: 21.7 %
WBC: 6.5 10*3/uL (ref 3.8–10.8)

## 2019-02-01 LAB — VITAMIN D 25 HYDROXY (VIT D DEFICIENCY, FRACTURES): Vit D, 25-Hydroxy: 25 ng/mL — ABNORMAL LOW (ref 30–100)

## 2019-02-01 LAB — LIPID PANEL
Cholesterol: 205 mg/dL — ABNORMAL HIGH (ref <200)
HDL: 64 mg/dL (ref >=50)
LDL Cholesterol (Calc): 118 mg/dL (calc) — ABNORMAL HIGH
Non-HDL Cholesterol (Calc): 141 mg/dL (calc) — ABNORMAL HIGH (ref <130)
Total CHOL/HDL Ratio: 3.2 (calc) (ref <5.0)
Triglycerides: 121 mg/dL (ref <150)

## 2019-02-01 LAB — TSH: TSH: 1.12 mIU/L

## 2019-02-03 ENCOUNTER — Other Ambulatory Visit: Payer: Self-pay

## 2019-02-03 ENCOUNTER — Encounter: Payer: Self-pay | Admitting: Internal Medicine

## 2019-02-03 ENCOUNTER — Ambulatory Visit (INDEPENDENT_AMBULATORY_CARE_PROVIDER_SITE_OTHER): Payer: BLUE CROSS/BLUE SHIELD | Admitting: Internal Medicine

## 2019-02-03 VITALS — BP 102/60 | HR 91 | Ht 68.0 in | Wt 143.0 lb

## 2019-02-03 DIAGNOSIS — Z0000 Encounter for general adult medical examination without abnormal findings: Secondary | ICD-10-CM | POA: Diagnosis not present

## 2019-02-03 DIAGNOSIS — E559 Vitamin D deficiency, unspecified: Secondary | ICD-10-CM | POA: Diagnosis not present

## 2019-02-03 DIAGNOSIS — Z8659 Personal history of other mental and behavioral disorders: Secondary | ICD-10-CM | POA: Diagnosis not present

## 2019-02-03 DIAGNOSIS — E7800 Pure hypercholesterolemia, unspecified: Secondary | ICD-10-CM

## 2019-02-03 LAB — POCT URINALYSIS DIPSTICK
Appearance: NEGATIVE
Bilirubin, UA: NEGATIVE
Blood, UA: NEGATIVE
Glucose, UA: NEGATIVE
Ketones, UA: NEGATIVE
Leukocytes, UA: NEGATIVE
Nitrite, UA: NEGATIVE
Odor: NEGATIVE
Protein, UA: NEGATIVE
Spec Grav, UA: 1.01 (ref 1.010–1.025)
Urobilinogen, UA: 0.2 E.U./dL
pH, UA: 6.5 (ref 5.0–8.0)

## 2019-02-03 MED ORDER — VITAMIN D (ERGOCALCIFEROL) 1.25 MG (50000 UNIT) PO CAPS: 50000.0000 [IU] | ORAL_CAPSULE | ORAL | 1 refills | Status: DC

## 2019-02-19 NOTE — Progress Notes (Signed)
Subjective:    Patient ID: Dawn Mcmillan, female    DOB: 18-Jan-1976, 43 y.o.   MRN: [id number]  HPI 43 year old Female seen today in person for health maintenance exam and evaluation of medical issues.  Her general health is excellent.  No history of serious illnesses, accidents or operations.  Social history: Married with 2 children.  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  Family history: Parents with history of hyperlipidemia.  Father is a retired Tax adviser.  Maternal grandmother with history of dementia.  Older brother with hyperlipidemia.  Father with history of detached retina and hip replacements.  She had colonoscopy May 2017 for hematochezia.  Was found to have an anal fissure and nonbleeding internal hemorrhoids.  She had 3 small polyps removed.  This included tubular adenomas and a hyperplastic polyp.   Take Lexapro for anxiety.  Not planning to travel with is much during the pandemic.  Fasting labs reviewed.  Her LDL is 119 and previously was 146 a year ago.  Total cholesterol 205.  HDL was 64 and triglycerides 121.  TSH is normal.  CBC normal.  Urinalysis by dipstick is normal.  Vitamin D is low at 25.  She will be placed on high-dose vitamin D 50,000 units weekly. Has difficulty remembering to take daily vitamin D supplement.          Review of Systems  Constitutional: Negative.   All other systems reviewed and are negative.      Objective:   Physical Exam Vitals signs reviewed.  Constitutional:      Appearance: Normal appearance. She is normal weight. She is not diaphoretic.  HENT:     Head: Normocephalic and atraumatic.     Right Ear: Tympanic membrane normal.     Left Ear: Tympanic membrane normal.     Nose: Nose normal.     Mouth/Throat:     Mouth: Mucous membranes are moist.     Pharynx: Oropharynx is clear.  Eyes:     General: No scleral icterus.       Right eye: No discharge.        Left eye: No discharge.      Pupils: Pupils are equal, round, and reactive to light.  Neck:     Musculoskeletal: Neck supple.  Cardiovascular:     Rate and Rhythm: Normal rate and regular rhythm.     Pulses: Normal pulses.     Heart sounds: No murmur.  Pulmonary:     Effort: No respiratory distress.     Breath sounds: No wheezing or rales.  Abdominal:     General: Bowel sounds are normal. There is no distension.     Palpations: Abdomen is soft. There is no mass.     Tenderness: There is no abdominal tenderness. There is no guarding.  Genitourinary:    Comments: Deferred to GYN physician Musculoskeletal:     Right lower leg: No edema.     Left lower leg: No edema.  Lymphadenopathy:     Cervical: No cervical adenopathy.  Skin:    General: Skin is warm and dry.  Neurological:     General: No focal deficit present.     Mental Status: She is alert and oriented to person, place, and time.     Cranial Nerves: No cranial nerve deficit.     Motor: No weakness.     Coordination: Coordination normal.  Psychiatric:        Mood and Affect:  Mood normal.        Behavior: Behavior normal.        Thought Content: Thought content normal.        Judgment: Judgment normal.           Assessment & Plan:  Elevated LDL- continue to work on diet and exercise.  We will not be treating with statin medication at this time.  Reevaluate in 1 year.  Vitamin D deficiency- prescription for Drisdol 50,000 units weekly for 6 months.  Then can reassess vitamin D level  History of anxiety treated with Lexapro  Plan: Physical exam to be done in 1 year.  Can repeat vitamin D level in 6 months.  Watch diet.

## 2019-02-19 NOTE — Patient Instructions (Addendum)
Continue Lexapro.  Take high-dose vitamin D weekly.  May repeat vitamin D level in 6 months.  Watch diet.  Continue exercise program.  It was a pleasure to see you today.

## 2019-03-27 ENCOUNTER — Telehealth: Payer: Self-pay

## 2019-03-27 NOTE — Telephone Encounter (Signed)
Patient called states she had an ear infection about 2 weeks ago her father who is a physician prescribed amoxicillin for 10 days. Her ears aren't hurting anymore but she has some vertigo and she has a sensation like her ears are clogged, she had tubes placed about a year and a half ago. She used to see Dr. Thornell Mule at the [facility] center but they are closed until August. She would like to know if she can come in to be seen here or would you like to refer her to the ENT? She was at a funeral 2 weeks go.

## 2019-03-27 NOTE — Telephone Encounter (Signed)
She can come tomorrow.

## 2019-03-27 NOTE — Telephone Encounter (Signed)
Scheduled appointment

## 2019-03-28 ENCOUNTER — Other Ambulatory Visit: Payer: Self-pay

## 2019-03-28 ENCOUNTER — Encounter: Payer: Self-pay | Admitting: Internal Medicine

## 2019-03-28 ENCOUNTER — Ambulatory Visit: Payer: BC Managed Care – PPO | Admitting: Internal Medicine

## 2019-03-28 VITALS — BP 90/60 | HR 68 | Temp 98.9°F | Ht 68.0 in | Wt 144.0 lb

## 2019-03-28 DIAGNOSIS — H9209 Otalgia, unspecified ear: Secondary | ICD-10-CM | POA: Diagnosis not present

## 2019-03-28 MED ORDER — FLUCONAZOLE 150 MG PO TABS: 150.0000 mg | ORAL_TABLET | Freq: Once | ORAL | 1 refills | Status: AC

## 2019-03-28 MED ORDER — DOXYCYCLINE HYCLATE 100 MG PO TABS: 100.0000 mg | ORAL_TABLET | Freq: Two times a day (BID) | ORAL | 0 refills | Status: DC

## 2019-03-28 MED ORDER — METHYLPREDNISOLONE ACETATE 80 MG/ML IJ SUSP
80.0000 mg | Freq: Once | INTRAMUSCULAR | Status: AC
  Administered 2019-03-28: 80 mg via INTRAMUSCULAR

## 2019-03-28 NOTE — Patient Instructions (Signed)
Doxycycline 100 mg twice daily x 10 days. Diflucan 150 mg if needed. Depomedrol 80 mg IM. ENT consult.

## 2019-04-04 NOTE — Telephone Encounter (Signed)
Dawn Mcmillan called back to check on referral to ENT, the antibiotics have not helped, both of her ears are still bothering her.

## 2019-04-04 NOTE — Telephone Encounter (Signed)
I was able to get her in with Dr Erik Obey on Thursday at 10:20

## 2019-04-04 NOTE — Telephone Encounter (Signed)
OK 

## 2019-04-05 NOTE — Progress Notes (Signed)
   Subjective:    Patient ID: Dawn Mcmillan, female    DOB: August 25, 1976, 43 y.o.   MRN: [id number]  HPI 43 year old Female with history of apparent eustachian tube dysfunction.  Also with history of TMJ discomfort.  Most recently has been followed by  Dr. Thornell Mule but he recently left practice in [place] to affiliate with [facility].  Around January 2019 she had bilateral grommet tubes placed by Dr. Thornell Mule.  Feels that symptoms never completely went away.  Is here today complaining of ear pressure.  Has no fever or chills.  No sore throat.  Has used Flonase nasal spray and taken over-the-counter decongestants with no relief over time.  Feels that her ears are stopped up and has pressure.  Note from Dr. Redmond Baseman in 2016 indicates she had had trouble with her ears for some 20 years mainly ear pressure.  She never had tympanostomy tubes placed during those 20 years of symptoms.  Dr. Redmond Baseman felt she had bruxism.  I have on file a note from Dr. Thornell Mule in January 2019 after she had had her tubes placed and he indicated they seem to be functioning well.      Review of Systems see above     Objective:   Physical Exam She is afebrile.  Vital signs reviewed.  No acute distress.  Pharynx is clear.  Neck is supple without adenopathy.  Both TMs appear to be clear in both grommet tubes appear to be clear of obstruction.  No drainage is noted from these tubes.       Assessment & Plan:  Bilateral ear pressure  Plan: We have agreed we will try a course of antibiotics to see if she gets symptom relief and if not she will seek ENT consultation.  She will take doxycycline 100 mg twice daily for 10 days.  Diflucan if needed for Candida vaginitis while on antibiotics.  Depo-Medrol 80 mg IM given today.

## 2019-04-06 DIAGNOSIS — H6983 Other specified disorders of Eustachian tube, bilateral: Secondary | ICD-10-CM | POA: Insufficient documentation

## 2019-04-06 DIAGNOSIS — Z01419 Encounter for gynecological examination (general) (routine) without abnormal findings: Secondary | ICD-10-CM | POA: Diagnosis not present

## 2019-04-06 DIAGNOSIS — Z6821 Body mass index (BMI) 21.0-21.9, adult: Secondary | ICD-10-CM | POA: Diagnosis not present

## 2019-04-06 DIAGNOSIS — R42 Dizziness and giddiness: Secondary | ICD-10-CM | POA: Diagnosis not present

## 2019-04-06 DIAGNOSIS — Z124 Encounter for screening for malignant neoplasm of cervix: Secondary | ICD-10-CM | POA: Diagnosis not present

## 2019-04-06 DIAGNOSIS — Z1231 Encounter for screening mammogram for malignant neoplasm of breast: Secondary | ICD-10-CM | POA: Diagnosis not present

## 2019-06-02 ENCOUNTER — Encounter: Payer: Self-pay | Admitting: Internal Medicine

## 2019-06-02 ENCOUNTER — Other Ambulatory Visit: Payer: Self-pay

## 2019-06-02 ENCOUNTER — Ambulatory Visit (INDEPENDENT_AMBULATORY_CARE_PROVIDER_SITE_OTHER): Payer: BC Managed Care – PPO | Admitting: Internal Medicine

## 2019-06-02 DIAGNOSIS — Z23 Encounter for immunization: Secondary | ICD-10-CM | POA: Diagnosis not present

## 2019-06-02 NOTE — Patient Instructions (Signed)
Patient received a flu vaccine IM L deltoid, AV, CMA  

## 2019-06-04 NOTE — Progress Notes (Signed)
Flu vaccine per CMA 

## 2019-08-16 DIAGNOSIS — D2262 Melanocytic nevi of left upper limb, including shoulder: Secondary | ICD-10-CM | POA: Diagnosis not present

## 2019-08-16 DIAGNOSIS — Z85828 Personal history of other malignant neoplasm of skin: Secondary | ICD-10-CM | POA: Diagnosis not present

## 2019-08-16 DIAGNOSIS — L821 Other seborrheic keratosis: Secondary | ICD-10-CM | POA: Diagnosis not present

## 2019-08-16 DIAGNOSIS — D2261 Melanocytic nevi of right upper limb, including shoulder: Secondary | ICD-10-CM | POA: Diagnosis not present

## 2019-10-02 DIAGNOSIS — M542 Cervicalgia: Secondary | ICD-10-CM | POA: Diagnosis not present

## 2019-10-12 DIAGNOSIS — M542 Cervicalgia: Secondary | ICD-10-CM | POA: Diagnosis not present

## 2019-10-20 DIAGNOSIS — M542 Cervicalgia: Secondary | ICD-10-CM | POA: Diagnosis not present

## 2019-11-04 DIAGNOSIS — H6690 Otitis media, unspecified, unspecified ear: Secondary | ICD-10-CM | POA: Diagnosis not present

## 2020-02-15 ENCOUNTER — Other Ambulatory Visit: Payer: Self-pay

## 2020-02-15 MED ORDER — VITAMIN D (ERGOCALCIFEROL) 1.25 MG (50000 UNIT) PO CAPS: 50000.0000 [IU] | ORAL_CAPSULE | ORAL | 0 refills | Status: DC

## 2020-03-21 ENCOUNTER — Other Ambulatory Visit: Payer: BC Managed Care – PPO | Admitting: Internal Medicine

## 2020-03-21 ENCOUNTER — Other Ambulatory Visit: Payer: Self-pay

## 2020-03-21 DIAGNOSIS — E785 Hyperlipidemia, unspecified: Secondary | ICD-10-CM

## 2020-03-21 DIAGNOSIS — E7800 Pure hypercholesterolemia, unspecified: Secondary | ICD-10-CM | POA: Diagnosis not present

## 2020-03-21 DIAGNOSIS — Z1329 Encounter for screening for other suspected endocrine disorder: Secondary | ICD-10-CM

## 2020-03-21 DIAGNOSIS — Z0000 Encounter for general adult medical examination without abnormal findings: Secondary | ICD-10-CM | POA: Diagnosis not present

## 2020-03-21 DIAGNOSIS — K589 Irritable bowel syndrome without diarrhea: Secondary | ICD-10-CM

## 2020-03-21 DIAGNOSIS — E559 Vitamin D deficiency, unspecified: Secondary | ICD-10-CM

## 2020-03-21 DIAGNOSIS — Z8659 Personal history of other mental and behavioral disorders: Secondary | ICD-10-CM

## 2020-03-21 LAB — COMPLETE METABOLIC PANEL WITH GFR
AG Ratio: 1.8 (calc) (ref 1.0–2.5)
ALT: 13 U/L (ref 6–29)
AST: 15 U/L (ref 10–30)
Albumin: 4.4 g/dL (ref 3.6–5.1)
Alkaline phosphatase (APISO): 39 U/L (ref 31–125)
BUN: 11 mg/dL (ref 7–25)
CO2: 26 mmol/L (ref 20–32)
Calcium: 9.3 mg/dL (ref 8.6–10.2)
Chloride: 102 mmol/L (ref 98–110)
Creat: 0.86 mg/dL (ref 0.50–1.10)
GFR, Est African American: 96 mL/min/{1.73_m2} (ref >=60)
GFR, Est Non African American: 83 mL/min/{1.73_m2} (ref >=60)
Globulin: 2.4 g/dL (calc) (ref 1.9–3.7)
Glucose, Bld: 91 mg/dL (ref 65–99)
Potassium: 4.1 mmol/L (ref 3.5–5.3)
Sodium: 138 mmol/L (ref 135–146)
Total Bilirubin: 0.5 mg/dL (ref 0.2–1.2)
Total Protein: 6.8 g/dL (ref 6.1–8.1)

## 2020-03-21 LAB — CBC WITH DIFFERENTIAL/PLATELET
Absolute Monocytes: 469 cells/uL (ref 200–950)
Basophils Absolute: 28 cells/uL (ref 0–200)
Basophils Relative: 0.4 %
Eosinophils Absolute: 192 cells/uL (ref 15–500)
Eosinophils Relative: 2.7 %
HCT: 41.2 % (ref 35.0–45.0)
Hemoglobin: 14 g/dL (ref 11.7–15.5)
Lymphs Abs: 1803 cells/uL (ref 850–3900)
MCH: 32.5 pg (ref 27.0–33.0)
MCHC: 34 g/dL (ref 32.0–36.0)
MCV: 95.6 fL (ref 80.0–100.0)
MPV: 9.9 fL (ref 7.5–12.5)
Monocytes Relative: 6.6 %
Neutro Abs: 4608 cells/uL (ref 1500–7800)
Neutrophils Relative %: 64.9 %
Platelets: 250 10*3/uL (ref 140–400)
RBC: 4.31 10*6/uL (ref 3.80–5.10)
RDW: 11.8 % (ref 11.0–15.0)
Total Lymphocyte: 25.4 %
WBC: 7.1 10*3/uL (ref 3.8–10.8)

## 2020-03-21 LAB — LIPID PANEL
Cholesterol: 215 mg/dL — ABNORMAL HIGH (ref <200)
HDL: 66 mg/dL (ref >=50)
LDL Cholesterol (Calc): 126 mg/dL (calc) — ABNORMAL HIGH
Non-HDL Cholesterol (Calc): 149 mg/dL (calc) — ABNORMAL HIGH (ref <130)
Total CHOL/HDL Ratio: 3.3 (calc) (ref <5.0)
Triglycerides: 118 mg/dL (ref <150)

## 2020-03-21 LAB — TSH: TSH: 1.16 mIU/L

## 2020-03-21 LAB — VITAMIN D 25 HYDROXY (VIT D DEFICIENCY, FRACTURES): Vit D, 25-Hydroxy: 56 ng/mL (ref 30–100)

## 2020-03-28 ENCOUNTER — Encounter: Payer: Self-pay | Admitting: Internal Medicine

## 2020-03-28 ENCOUNTER — Ambulatory Visit (INDEPENDENT_AMBULATORY_CARE_PROVIDER_SITE_OTHER): Payer: BC Managed Care – PPO | Admitting: Internal Medicine

## 2020-03-28 ENCOUNTER — Other Ambulatory Visit: Payer: Self-pay

## 2020-03-28 VITALS — BP 110/70 | HR 64 | Ht 68.0 in | Wt 148.0 lb

## 2020-03-28 DIAGNOSIS — Z0000 Encounter for general adult medical examination without abnormal findings: Secondary | ICD-10-CM | POA: Diagnosis not present

## 2020-03-28 DIAGNOSIS — E7800 Pure hypercholesterolemia, unspecified: Secondary | ICD-10-CM

## 2020-03-28 LAB — POCT URINALYSIS DIPSTICK
Appearance: NEGATIVE
Bilirubin, UA: NEGATIVE
Blood, UA: NEGATIVE
Glucose, UA: NEGATIVE
Ketones, UA: NEGATIVE
Leukocytes, UA: NEGATIVE
Nitrite, UA: NEGATIVE
Odor: NEGATIVE
Protein, UA: NEGATIVE
Spec Grav, UA: 1.01 (ref 1.010–1.025)
Urobilinogen, UA: 0.2 E.U./dL
pH, UA: 6.5 (ref 5.0–8.0)

## 2020-03-28 MED ORDER — VITAMIN D (ERGOCALCIFEROL) 1.25 MG (50000 UNIT) PO CAPS: 50000.0000 [IU] | ORAL_CAPSULE | ORAL | 3 refills | Status: DC

## 2020-03-28 NOTE — Patient Instructions (Addendum)
It was a pleasure to see you today.  Watch diet a bit as your  LDL is mildly elevated  Return in 1 year or as needed.

## 2020-03-28 NOTE — Progress Notes (Signed)
   Subjective:    Patient ID: Dawn Mcmillan, female    DOB: 18-Sep-1976, 44 y.o.   MRN: [id number]  HPI 44 year old Female for health maintenance exam and evaluation of medical issues. Hx tubular adenomas with colonoscopy 2017 and follow up recommended next year. Having some  menometrorrhagia. Has GYN appt soon.   Her general health is excellent.  No history of serious illnesses accidents, or operations.  Social history: Married with 2 children.  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  It is near the [place] on [address].  Has been able to work from home which has been good so she does not have to travel as much as she did previously.  Exercises regularly.  Family history: Parents with history of hyperlipidemia.  Maternal grandmother with history of dementia.  Older brother with hyperlipidemia.  Father with history of detached retina and hip replacements.  In May 2017 she had colonoscopy for hematochezia and was found to have an anal fissure and nonbleeding internal hemorrhoids.  3 small polyps removed this included tubular adenomas and a hyperplastic polyp.  Has received the [employer] vaccine in April 2021.  Had tetanus immunization in 2014 and gets annual flu vaccine.  Review of Systems  Respiratory: Negative.   Cardiovascular: Negative.   Gastrointestinal: Negative.   Genitourinary: Negative.   Neurological: Negative.   Psychiatric/Behavioral: Negative.    Recently struck left elbow near olecranon  -should resolve.  Seems to be a minor injury.    Objective:   Physical Exam  Blood pressure 110/70 pulse 64 pulse oximetry 98% weight 148 pounds BMI 22.50  Skin warm and dry.  Nodes none.  TMs clear.  Neck supple without thyromegaly.  Chest clear to auscultation.  Cardiac exam regular rate and rhythm normal S1 and S2 without murmurs.  Abdomen soft nondistended without hepatosplenomegaly masses or tenderness.  Breast and GYN exam-deferred to GYN  No  deformities of the lower extremities  Affect thought and judgment are normal     Assessment & Plan:  Pure hypercholesterolemia-total cholesterol 215, triglycerides 118, HDL 66 and LDL 126 with similar findings in 2020.  Patient will continue with diet and exercise program.  Remainder of labs including CBC c-Met TSH vitamin D and dipstick UA are normal.  Return in 1 year or as needed.

## 2020-05-01 DIAGNOSIS — Z01419 Encounter for gynecological examination (general) (routine) without abnormal findings: Secondary | ICD-10-CM | POA: Diagnosis not present

## 2020-05-01 DIAGNOSIS — Z1231 Encounter for screening mammogram for malignant neoplasm of breast: Secondary | ICD-10-CM | POA: Diagnosis not present

## 2020-05-01 DIAGNOSIS — Z6822 Body mass index (BMI) 22.0-22.9, adult: Secondary | ICD-10-CM | POA: Diagnosis not present

## 2020-05-01 DIAGNOSIS — Z124 Encounter for screening for malignant neoplasm of cervix: Secondary | ICD-10-CM | POA: Diagnosis not present

## 2020-05-02 ENCOUNTER — Other Ambulatory Visit: Payer: Self-pay | Admitting: Obstetrics and Gynecology

## 2020-05-02 DIAGNOSIS — R928 Other abnormal and inconclusive findings on diagnostic imaging of breast: Secondary | ICD-10-CM

## 2020-05-17 ENCOUNTER — Other Ambulatory Visit: Payer: Self-pay

## 2020-05-17 ENCOUNTER — Ambulatory Visit
Admission: RE | Admit: 2020-05-17 | Discharge: 2020-05-17 | Disposition: A | Discharge status: Home / Self Care | Payer: BC Managed Care – PPO | Source: Ambulatory Visit | Attending: Obstetrics and Gynecology | Admitting: Obstetrics and Gynecology

## 2020-05-17 DIAGNOSIS — R928 Other abnormal and inconclusive findings on diagnostic imaging of breast: Secondary | ICD-10-CM

## 2020-05-17 DIAGNOSIS — R921 Mammographic calcification found on diagnostic imaging of breast: Secondary | ICD-10-CM | POA: Diagnosis not present

## 2020-07-23 DIAGNOSIS — L02413 Cutaneous abscess of right upper limb: Secondary | ICD-10-CM | POA: Diagnosis not present

## 2020-08-19 ENCOUNTER — Encounter: Payer: Self-pay | Admitting: Internal Medicine

## 2020-08-19 DIAGNOSIS — D2261 Melanocytic nevi of right upper limb, including shoulder: Secondary | ICD-10-CM | POA: Diagnosis not present

## 2020-08-19 DIAGNOSIS — D2262 Melanocytic nevi of left upper limb, including shoulder: Secondary | ICD-10-CM | POA: Diagnosis not present

## 2020-08-19 DIAGNOSIS — D485 Neoplasm of uncertain behavior of skin: Secondary | ICD-10-CM | POA: Diagnosis not present

## 2020-08-19 DIAGNOSIS — D225 Melanocytic nevi of trunk: Secondary | ICD-10-CM | POA: Diagnosis not present

## 2020-08-19 DIAGNOSIS — D1721 Benign lipomatous neoplasm of skin and subcutaneous tissue of right arm: Secondary | ICD-10-CM | POA: Diagnosis not present

## 2020-09-10 ENCOUNTER — Encounter: Payer: Self-pay | Admitting: Internal Medicine

## 2020-09-10 DIAGNOSIS — D485 Neoplasm of uncertain behavior of skin: Secondary | ICD-10-CM | POA: Diagnosis not present

## 2020-09-10 DIAGNOSIS — L988 Other specified disorders of the skin and subcutaneous tissue: Secondary | ICD-10-CM | POA: Diagnosis not present

## 2021-01-22 ENCOUNTER — Other Ambulatory Visit: Payer: Self-pay | Admitting: Internal Medicine

## 2021-01-22 NOTE — Telephone Encounter (Signed)
Due for PE and labs  in July- last seen for PE July 2021. Please book before refilling Vitamin D

## 2021-01-23 NOTE — Telephone Encounter (Signed)
Scheduled CPE 

## 2021-03-27 ENCOUNTER — Other Ambulatory Visit: Payer: BC Managed Care – PPO | Admitting: Internal Medicine

## 2021-03-27 ENCOUNTER — Other Ambulatory Visit: Payer: Self-pay

## 2021-03-27 DIAGNOSIS — K589 Irritable bowel syndrome without diarrhea: Secondary | ICD-10-CM

## 2021-03-27 DIAGNOSIS — E785 Hyperlipidemia, unspecified: Secondary | ICD-10-CM

## 2021-03-27 DIAGNOSIS — Z13220 Encounter for screening for lipoid disorders: Secondary | ICD-10-CM

## 2021-03-27 DIAGNOSIS — H9209 Otalgia, unspecified ear: Secondary | ICD-10-CM

## 2021-03-27 DIAGNOSIS — E7800 Pure hypercholesterolemia, unspecified: Secondary | ICD-10-CM | POA: Diagnosis not present

## 2021-03-27 DIAGNOSIS — Z0000 Encounter for general adult medical examination without abnormal findings: Secondary | ICD-10-CM

## 2021-03-27 DIAGNOSIS — Z1329 Encounter for screening for other suspected endocrine disorder: Secondary | ICD-10-CM

## 2021-03-27 LAB — CBC WITH DIFFERENTIAL/PLATELET
Absolute Monocytes: 363 cells/uL (ref 200–950)
Basophils Absolute: 33 cells/uL (ref 0–200)
Basophils Relative: 0.5 %
Eosinophils Absolute: 112 cells/uL (ref 15–500)
Eosinophils Relative: 1.7 %
HCT: 44.9 % (ref 35.0–45.0)
Hemoglobin: 15.1 g/dL (ref 11.7–15.5)
Lymphs Abs: 1459 cells/uL (ref 850–3900)
MCH: 32.4 pg (ref 27.0–33.0)
MCHC: 33.6 g/dL (ref 32.0–36.0)
MCV: 96.4 fL (ref 80.0–100.0)
MPV: 9.8 fL (ref 7.5–12.5)
Monocytes Relative: 5.5 %
Neutro Abs: 4633 cells/uL (ref 1500–7800)
Neutrophils Relative %: 70.2 %
Platelets: 284 10*3/uL (ref 140–400)
RBC: 4.66 10*6/uL (ref 3.80–5.10)
RDW: 11.9 % (ref 11.0–15.0)
Total Lymphocyte: 22.1 %
WBC: 6.6 10*3/uL (ref 3.8–10.8)

## 2021-03-27 LAB — COMPLETE METABOLIC PANEL WITH GFR
AG Ratio: 1.8 (calc) (ref 1.0–2.5)
ALT: 16 U/L (ref 6–29)
AST: 17 U/L (ref 10–30)
Albumin: 4.5 g/dL (ref 3.6–5.1)
Alkaline phosphatase (APISO): 48 U/L (ref 31–125)
BUN: 13 mg/dL (ref 7–25)
CO2: 26 mmol/L (ref 20–32)
Calcium: 9.5 mg/dL (ref 8.6–10.2)
Chloride: 103 mmol/L (ref 98–110)
Creat: 1.07 mg/dL (ref 0.50–1.10)
GFR, Est African American: 73 mL/min/{1.73_m2} (ref >=60)
GFR, Est Non African American: 63 mL/min/{1.73_m2} (ref >=60)
Globulin: 2.5 g/dL (calc) (ref 1.9–3.7)
Glucose, Bld: 81 mg/dL (ref 65–99)
Potassium: 4.5 mmol/L (ref 3.5–5.3)
Sodium: 138 mmol/L (ref 135–146)
Total Bilirubin: 0.5 mg/dL (ref 0.2–1.2)
Total Protein: 7 g/dL (ref 6.1–8.1)

## 2021-03-27 LAB — LIPID PANEL
Cholesterol: 230 mg/dL — ABNORMAL HIGH (ref <200)
HDL: 82 mg/dL (ref >=50)
LDL Cholesterol (Calc): 128 mg/dL (calc) — ABNORMAL HIGH
Non-HDL Cholesterol (Calc): 148 mg/dL (calc) — ABNORMAL HIGH (ref <130)
Total CHOL/HDL Ratio: 2.8 (calc) (ref <5.0)
Triglycerides: 97 mg/dL (ref <150)

## 2021-03-27 LAB — TSH: TSH: 1.12 mIU/L

## 2021-04-01 ENCOUNTER — Encounter: Payer: Self-pay | Admitting: Internal Medicine

## 2021-04-01 ENCOUNTER — Other Ambulatory Visit: Payer: Self-pay

## 2021-04-01 ENCOUNTER — Ambulatory Visit (INDEPENDENT_AMBULATORY_CARE_PROVIDER_SITE_OTHER): Payer: BC Managed Care – PPO | Admitting: Internal Medicine

## 2021-04-01 VITALS — BP 110/80 | HR 78 | Ht 68.0 in | Wt 156.0 lb

## 2021-04-01 DIAGNOSIS — Z0000 Encounter for general adult medical examination without abnormal findings: Secondary | ICD-10-CM

## 2021-04-01 DIAGNOSIS — Z86010 Personal history of colonic polyps: Secondary | ICD-10-CM | POA: Diagnosis not present

## 2021-04-01 DIAGNOSIS — E7800 Pure hypercholesterolemia, unspecified: Secondary | ICD-10-CM | POA: Diagnosis not present

## 2021-04-01 LAB — POCT URINALYSIS DIPSTICK
Appearance: NEGATIVE
Bilirubin, UA: NEGATIVE
Blood, UA: NEGATIVE
Glucose, UA: NEGATIVE
Ketones, UA: NEGATIVE
Leukocytes, UA: NEGATIVE
Nitrite, UA: NEGATIVE
Odor: NEGATIVE
Protein, UA: NEGATIVE
Spec Grav, UA: 1.01 (ref 1.010–1.025)
Urobilinogen, UA: 0.2 E.U./dL
pH, UA: 7 (ref 5.0–8.0)

## 2021-04-01 MED ORDER — VITAMIN D (ERGOCALCIFEROL) 1.25 MG (50000 UNIT) PO CAPS: 50000.0000 [IU] | ORAL_CAPSULE | ORAL | 3 refills | Status: DC

## 2021-04-01 NOTE — Progress Notes (Signed)
Subjective:    Patient ID: Dawn Mcmillan, female    DOB: 07-Oct-1975, 45 y.o.   MRN: [id number]  HPI 45 year old Female seen for health maintenance exam and evaluation of medical issues.  History of tubular adenomas on colonoscopy in 2017 and 5-year follow-up recommended by Dr. Havery Moros.  Referral will be made.  In May 2017 she had colonoscopy for hematochezia and was found to have an anal fissure and nonbleeding internal hemorrhoids.  Had tubular adenomas and a hyperplastic polyp.  Her general health is excellent.  No history of serious illnesses, accidents or operations.  Social history: She is married with 2 children.  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  She exercises regularly.  Family history: Parents with history of hyperlipidemia.  Older brother with hyperlipidemia.  Maternal grandmother with history of dementia.  Father with history of detached retina and hip arthroplasties.   Our records indicate 1 J&J vaccine in March 2021.  Tetanus immunization is up-to-date.  No history of serious illnesses, accidents or operations.   Review of Systems  Constitutional: Negative.   Respiratory: Negative.    Cardiovascular: Negative.   Gastrointestinal: Negative.   Genitourinary: Negative.   Neurological: Negative.   Psychiatric/Behavioral: Negative.         Objective:   Physical Exam Vitals reviewed.  Constitutional:      Appearance: Normal appearance.  HENT:     Right Ear: Tympanic membrane normal.     Left Ear: Tympanic membrane normal.     Nose: Nose normal. No rhinorrhea.     Mouth/Throat:     Pharynx: Oropharynx is clear.  Eyes:     General: No scleral icterus.    Extraocular Movements: Extraocular movements intact.     Conjunctiva/sclera: Conjunctivae normal.     Pupils: Pupils are equal, round, and reactive to light.  Cardiovascular:     Rate and Rhythm: Normal rate and regular rhythm.      Heart sounds: Normal heart sounds. No murmur heard. Pulmonary:     Effort: Pulmonary effort is normal.     Breath sounds: Normal breath sounds.  Abdominal:     General: Bowel sounds are normal.     Palpations: Abdomen is soft. There is no mass.     Tenderness: There is no abdominal tenderness. There is no guarding or rebound.  Musculoskeletal:        General: No deformity.     Cervical back: Neck supple. No rigidity.     Right lower leg: No edema.     Left lower leg: No edema.  Lymphadenopathy:     Cervical: No cervical adenopathy.  Skin:    General: Skin is warm and dry.     Findings: No rash.  Neurological:     General: No focal deficit present.     Mental Status: She is alert and oriented to person, place, and time.     Cranial Nerves: No cranial nerve deficit.     Sensory: No sensory deficit.     Motor: No weakness.     Coordination: Coordination normal.  Psychiatric:        Mood and Affect: Mood normal.        Behavior: Behavior normal.        Thought Content: Thought content normal.        Judgment: Judgment normal.          Assessment & Plan:  History of tubular adenomas-needs repeat colonoscopy per recommendation of Dr. Havery Moros  Pure hypercholesterolemia-does not want to be on statin therapy.  Continue with diet and exercise efforts.  Total cholesterol 230, HDL 82, triglycerides 97 and LDL 128.  Continue to monitor annually.  Health maintenance-consider COVID booster  Plan: Return in 1 year or as needed.  Have annual mammogram.  See GYN later this summer.

## 2021-04-09 ENCOUNTER — Encounter: Payer: Self-pay | Admitting: Gastroenterology

## 2021-04-20 NOTE — Patient Instructions (Addendum)
It was a pleasure to see you today.  We will make referral for colonoscopy regarding follow-up of  adenomatous polyps.  Continue to watch diet and continue exercise program due to elevated cholesterol.  Consider COVID booster.  Return in 1 year.

## 2021-04-30 ENCOUNTER — Other Ambulatory Visit: Payer: Self-pay

## 2021-04-30 ENCOUNTER — Ambulatory Visit (AMBULATORY_SURGERY_CENTER): Payer: BC Managed Care – PPO

## 2021-04-30 VITALS — Ht 68.0 in | Wt 150.0 lb

## 2021-04-30 DIAGNOSIS — Z86010 Personal history of colonic polyps: Secondary | ICD-10-CM

## 2021-04-30 MED ORDER — PEG-KCL-NACL-NASULF-NA ASC-C 100 G PO SOLR: 1.0000 | Freq: Once | ORAL | 0 refills | Status: AC

## 2021-04-30 NOTE — Progress Notes (Signed)
Patient's pre-visit was done today over the phone with the patient  Name,DOB and address verified. Insurance verified. Patient denies any problems with anesthesia/sedation. Patient denies taking diet pills or blood thinners. No home Oxygen. Packet of Prep instructions mailed to patient including a copy of a consent form-pt is aware. Patient understands to call us back with any questions or concerns. Patient is aware of our care-partner policy and 0000000 safety protocol.   EMMI education assigned to the patient for the procedure, sent to [facility].   The patient is COVID-19 vaccinated.

## 2021-05-16 ENCOUNTER — Encounter: Payer: BC Managed Care – PPO | Admitting: Gastroenterology

## 2021-05-20 ENCOUNTER — Encounter: Payer: BC Managed Care – PPO | Admitting: Gastroenterology

## 2021-06-25 ENCOUNTER — Ambulatory Visit (AMBULATORY_SURGERY_CENTER): Payer: BC Managed Care – PPO | Admitting: *Deleted

## 2021-06-25 ENCOUNTER — Encounter: Payer: Self-pay | Admitting: Gastroenterology

## 2021-06-25 ENCOUNTER — Other Ambulatory Visit: Payer: Self-pay

## 2021-06-25 VITALS — Ht 68.0 in | Wt 150.0 lb

## 2021-06-25 DIAGNOSIS — Z86010 Personal history of colonic polyps: Secondary | ICD-10-CM

## 2021-06-25 MED ORDER — MOVIPREP 100 G PO SOLR: 1.0000 | Freq: Once | ORAL | 0 refills | Status: AC

## 2021-06-25 NOTE — Progress Notes (Signed)
Pt verified name, DOB, address and insurance during PV today.  Pt mailed instruction packet of Emmi video, copy of consent form to read and not return, and instructions.  PV completed over the phone.  Pt encouraged to call with questions or issues.  My Chart instructions to pt as well    No egg or soy allergy known to patient  No issues known to pt with past sedation with any surgeries or procedures Patient denies ever being told they had issues or difficulty with intubation  No FH of Malignant Hyperthermia Pt is not on diet pills Pt is not on  home 02  Pt is not on blood thinners  Pt denies issues with constipation  No A fib or A flutter  Pt is fully vaccinated  for Covid    NO PA's for preps discussed with pt In PV today  Discussed with pt there will be an out-of-pocket cost for prep and that varies from $0 to 70 +  dollars - pt verbalized understanding   Due to the COVID-19 pandemic we are asking patients to follow certain guidelines in PV and the [facility]   Pt aware of COVID protocols and [facility] guidelines

## 2021-06-27 ENCOUNTER — Encounter: Payer: Self-pay | Admitting: Gastroenterology

## 2021-07-08 DIAGNOSIS — Z6824 Body mass index (BMI) 24.0-24.9, adult: Secondary | ICD-10-CM | POA: Diagnosis not present

## 2021-07-08 DIAGNOSIS — Z1231 Encounter for screening mammogram for malignant neoplasm of breast: Secondary | ICD-10-CM | POA: Diagnosis not present

## 2021-07-08 DIAGNOSIS — Z01419 Encounter for gynecological examination (general) (routine) without abnormal findings: Secondary | ICD-10-CM | POA: Diagnosis not present

## 2021-07-08 LAB — HM PAP SMEAR: HPV, high-risk: NEGATIVE

## 2021-07-09 ENCOUNTER — Ambulatory Visit (AMBULATORY_SURGERY_CENTER): Payer: BC Managed Care – PPO | Admitting: Gastroenterology

## 2021-07-09 ENCOUNTER — Encounter: Payer: Self-pay | Admitting: Gastroenterology

## 2021-07-09 VITALS — BP 106/61 | HR 61 | Temp 98.7°F | Resp 13 | Ht 68.0 in | Wt 153.0 lb

## 2021-07-09 DIAGNOSIS — D120 Benign neoplasm of cecum: Secondary | ICD-10-CM

## 2021-07-09 DIAGNOSIS — Z86010 Personal history of colonic polyps: Secondary | ICD-10-CM

## 2021-07-09 DIAGNOSIS — Z1211 Encounter for screening for malignant neoplasm of colon: Secondary | ICD-10-CM

## 2021-07-09 MED ORDER — SODIUM CHLORIDE 0.9 % IV SOLN: 500.0000 mL | Freq: Once | INTRAVENOUS | Status: DC

## 2021-07-09 NOTE — Progress Notes (Signed)
[facility] History and Physical   Primary Care Physician:  Elby Showers, MD   Reason for Procedure:   History of colon polyps  Plan:    colonoscopy     HPI: Dawn Mcmillan is a 45 y.o. female  here for colonoscopy surveillance exam. She has had 2 adenomas removed 01/2016 at the age of 71. Patient baseline mixed type IBS without any changes since I have last seem her. No family history of colon cancer known. Otherwise feels well without any cardiopulmonary symptoms. Have discussed risks / benefits of colonoscopy and anesthesia and she wishes to proceed.   Past Medical History:  Diagnosis Date   Allergy    AMA (advanced maternal age) multigravida 35+    Anemia    Depression    GERD (gastroesophageal reflux disease)    during pregnancy   H/O varicella    Hyperlipemia    Irritable bowel syndrome    No pertinent past medical history     Past Surgical History:  Procedure Laterality Date   COLONOSCOPY     DILATION AND CURETTAGE OF UTERUS     x 2   PILONIDAL CYST EXCISION  09/21/2001   POLYPECTOMY     WISDOM TOOTH EXTRACTION      Prior to Admission medications   Medication Sig Start Date End Date Taking? Authorizing Provider  Vitamin D, Ergocalciferol, (DRISDOL) 1.25 MG (50000 UNIT) CAPS capsule Take 1 capsule (50,000 Units total) by mouth every 7 (seven) days. 04/01/21  Yes BaxleyCresenciano Lick, MD    Current Outpatient Medications  Medication Sig Dispense Refill   Vitamin D, Ergocalciferol, (DRISDOL) 1.25 MG (50000 UNIT) CAPS capsule Take 1 capsule (50,000 Units total) by mouth every 7 (seven) days. 12 capsule 3   Current Facility-Administered Medications  Medication Dose Route Frequency Provider Last Rate Last Admin   0.9 %  sodium chloride infusion  500 mL Intravenous Once Karishma Unrein, Carlota Raspberry, MD        Allergies as of 07/09/2021   (No Known Allergies)    Family History  Problem Relation Age of Onset   Arthritis Mother    Thrombophlebitis Mother     Hypothyroidism Mother    Colon polyps Mother    Heart disease Father    Hypertension Father    Colon polyps Brother    Thrombophlebitis Maternal Grandmother    Diabetes Maternal Grandmother    Colon polyps Maternal Grandfather    Colonic polyp Maternal Grandfather    Diabetes Paternal Grandmother    Breast cancer Paternal Grandmother    Heart disease Paternal Grandfather        blocked arteries   Colon cancer Neg Hx    Esophageal cancer Neg Hx    Pancreatic cancer Neg Hx    Rectal cancer Neg Hx    Stomach cancer Neg Hx     Social History   Socioeconomic History   Marital status: Married    Spouse name: Not on file   Number of children: 2   Years of education: Not on file   Highest education level: Not on file  Occupational History   Occupation: Runner, broadcasting/film/video    Employer: [employer]  Tobacco Use   Smoking status: Former    Types: Cigarettes    Quit date: 03/15/2002    Years since quitting: 19.3   Smokeless tobacco: Never  Vaping Use   Vaping Use: Never used  Substance and Sexual Activity   Alcohol use: Yes  Alcohol/week: 7.0 standard drinks    Types: 7 Glasses of wine per week    Comment: dsily wine   Drug use: No   Sexual activity: Yes  Other Topics Concern   Not on file  Social History Narrative   Not on file   Social Determinants of Health   Financial Resource Strain: Not on file  Food Insecurity: Not on file  Transportation Needs: Not on file  Physical Activity: Not on file  Stress: Not on file  Social Connections: Not on file  Intimate Partner Violence: Not on file    Review of Systems: All other review of systems negative except as mentioned in the HPI.  Physical Exam: Vital signs BP 118/71   Pulse 73   Temp 98.7 F (37.1 C) (Skin)   Resp 12   Ht 5\' 8"  (1.727 m)   Wt 153 lb (69.4 kg)   SpO2 100%   BMI 23.26 kg/m   General:   Alert,  Well-developed, , pleasant and cooperative in NAD Lungs:  Clear throughout to  auscultation.   Heart:  Regular rate and rhythm Abdomen:  Soft, nontender and nondistended.   Neuro/Psych:  Alert and cooperative. Normal mood and affect. A and O x 3  [name], MD [facility]

## 2021-07-09 NOTE — Progress Notes (Signed)
Called to room to assist during endoscopic procedure.  Patient ID and intended procedure confirmed with present staff. Received instructions for my participation in the procedure from the performing physician.  

## 2021-07-09 NOTE — Op Note (Signed)
[facility] Patient Name: Dawn Mcmillan Procedure Date: 07/09/2021 9:28 AM MRN: [id number] Endoscopist: Remo Lipps P. Havery Moros , MD Age: 45 Referring MD:  Date of Birth: 17-Jun-1976 Gender: Female Account #: [id number] Procedure:                Colonoscopy Indications:              High risk colon cancer surveillance: Personal                            history of colonic polyps - patient with 2 adenomas                            removed 01/2016 at age 86, here for surveilance                            colonoscopy Medicines:                Monitored Anesthesia Care Procedure:                Pre-Anesthesia Assessment:                           - Prior to the procedure, a History and Physical                            was performed, and patient medications and                            allergies were reviewed. The patient's tolerance of                            previous anesthesia was also reviewed. The risks                            and benefits of the procedure and the sedation                            options and risks were discussed with the patient.                            All questions were answered, and informed consent                            was obtained. Prior Anticoagulants: The patient has                            taken no previous anticoagulant or antiplatelet                            agents. ASA Grade Assessment: I - A normal, healthy                            patient. After reviewing the risks and benefits,  the patient was deemed in satisfactory condition to                            undergo the procedure.                           After obtaining informed consent, the colonoscope                            was passed under direct vision. Throughout the                            procedure, the patient's blood pressure, pulse, and                            oxygen saturations were monitored continuously. The                             PCF-HQ190L Colonoscope was introduced through the                            anus and advanced to the the terminal ileum, with                            identification of the appendiceal orifice and IC                            valve. The colonoscopy was performed without                            difficulty. The patient tolerated the procedure                            well. The quality of the bowel preparation was                            adequate. The terminal ileum, ileocecal valve,                            appendiceal orifice, and rectum were photographed. Scope In: 9:39:02 AM Scope Out: 10:01:43 AM Scope Withdrawal Time: 0 hours 18 minutes 32 seconds  Total Procedure Duration: 0 hours 22 minutes 41 seconds  Findings:                 The perianal and digital rectal examinations were                            normal.                           The terminal ileum appeared normal.                           Two sessile polyps were found in the cecum. The  polyps were diminutive in size. These polyps were                            removed with a cold snare. Resection and retrieval                            were complete.                           Internal hemorrhoids were found during                            retroflexion. The hemorrhoids were small.                           The exam was otherwise without abnormality. Complications:            No immediate complications. Estimated blood loss:                            Minimal. Estimated Blood Loss:     Estimated blood loss was minimal. Impression:               - The examined portion of the ileum was normal.                           - Two diminutive polyps in the cecum, removed with                            a cold snare. Resected and retrieved.                           - Internal hemorrhoids.                           - The examination was otherwise  normal. Recommendation:           - Patient has a contact number available for                            emergencies. The signs and symptoms of potential                            delayed complications were discussed with the                            patient. Return to normal activities tomorrow.                            Written discharge instructions were provided to the                            patient.                           - Resume previous diet.                           -  Continue present medications.                           - Await pathology results. Remo Lipps P. Harsimran Westman, MD 07/09/2021 10:06:03 AM This report has been signed electronically.

## 2021-07-09 NOTE — Progress Notes (Signed)
To pacu, VSS. Report to [email or web address] 

## 2021-07-09 NOTE — Progress Notes (Signed)
Pt's states no medical or surgical changes since previsit or office visit. VS assessed by C.W 

## 2021-07-09 NOTE — Patient Instructions (Addendum)
Await pathology  Please read over handouts about polyps and hemorrhoids  Continue your normal medications   YOU HAD AN ENDOSCOPIC PROCEDURE TODAY AT [place]:   Refer to the procedure report that was given to you for any specific questions about what was found during the examination.  If the procedure report does not answer your questions, please call your gastroenterologist to clarify.  If you requested that your care partner not be given the details of your procedure findings, then the procedure report has been included in a sealed envelope for you to review at your convenience later.  YOU SHOULD EXPECT: Some feelings of bloating in the abdomen. Passage of more gas than usual.  Walking can help get rid of the air that was put into your GI tract during the procedure and reduce the bloating. If you had a lower endoscopy (such as a colonoscopy or flexible sigmoidoscopy) you may notice spotting of blood in your stool or on the toilet paper. If you underwent a bowel prep for your procedure, you may not have a normal bowel movement for a few days.  Please Note:  You might notice some irritation and congestion in your nose or some drainage.  This is from the oxygen used during your procedure.  There is no need for concern and it should clear up in a day or so.  SYMPTOMS TO REPORT IMMEDIATELY:  Following lower endoscopy (colonoscopy or flexible sigmoidoscopy):  Excessive amounts of blood in the stool  Significant tenderness or worsening of abdominal pains  Swelling of the abdomen that is new, acute  Fever of 100F or higher  For urgent or emergent issues, a gastroenterologist can be reached at any hour by calling [phone]. Do not use MyChart messaging for urgent concerns.    DIET:  We do recommend a small meal at first, but then you may proceed to your regular diet.  Drink plenty of fluids but you should avoid alcoholic beverages for 24 hours.  ACTIVITY:  You should  plan to take it easy for the rest of today and you should NOT DRIVE or use heavy machinery until tomorrow (because of the sedation medicines used during the test).    FOLLOW UP: Our staff will call the number listed on your records 48-72 hours following your procedure to check on you and address any questions or concerns that you may have regarding the information given to you following your procedure. If we do not reach you, we will leave a message.  We will attempt to reach you two times.  During this call, we will ask if you have developed any symptoms of COVID 19. If you develop any symptoms (ie: fever, flu-like symptoms, shortness of breath, cough etc.) before then, please call [phone].  If you test positive for Covid 19 in the 2 weeks post procedure, please call and report this information to [place].    If any biopsies were taken you will be contacted by phone or by letter within the next 1-3 weeks.  Please call us at [phone] if you have not heard about the biopsies in 3 weeks.    SIGNATURES/CONFIDENTIALITY: You and/or your care partner have signed paperwork which will be entered into your electronic medical record.  These signatures attest to the fact that that the information above on your After Visit Summary has been reviewed and is understood.  Full responsibility of the confidentiality of this discharge information lies with you and/or your care-partner.

## 2021-07-11 ENCOUNTER — Telehealth: Payer: Self-pay

## 2021-07-11 ENCOUNTER — Telehealth: Payer: Self-pay | Admitting: *Deleted

## 2021-07-11 NOTE — Telephone Encounter (Signed)
No answer for post procedure call back.

## 2021-07-11 NOTE — Telephone Encounter (Signed)
First attempt follow up call to pt, no answer. 

## 2021-09-24 DIAGNOSIS — D2271 Melanocytic nevi of right lower limb, including hip: Secondary | ICD-10-CM | POA: Diagnosis not present

## 2021-09-24 DIAGNOSIS — D2272 Melanocytic nevi of left lower limb, including hip: Secondary | ICD-10-CM | POA: Diagnosis not present

## 2021-09-24 DIAGNOSIS — D225 Melanocytic nevi of trunk: Secondary | ICD-10-CM | POA: Diagnosis not present

## 2021-09-24 DIAGNOSIS — D2262 Melanocytic nevi of left upper limb, including shoulder: Secondary | ICD-10-CM | POA: Diagnosis not present

## 2022-04-17 ENCOUNTER — Telehealth: Payer: Self-pay | Admitting: Internal Medicine

## 2022-04-17 NOTE — Telephone Encounter (Signed)
LVM to CB to reschedule CPE, Dr Renold Genta is going to be out of office 06/26/2022

## 2022-04-23 NOTE — Telephone Encounter (Signed)
LVM to CB and reschedule CPE

## 2022-04-23 NOTE — Telephone Encounter (Signed)
Called back and rescheduled.

## 2022-05-14 ENCOUNTER — Ambulatory Visit: Payer: BC Managed Care – PPO | Admitting: Podiatry

## 2022-05-14 ENCOUNTER — Encounter: Payer: Self-pay | Admitting: Podiatry

## 2022-05-14 ENCOUNTER — Ambulatory Visit (INDEPENDENT_AMBULATORY_CARE_PROVIDER_SITE_OTHER): Payer: BC Managed Care – PPO

## 2022-05-14 DIAGNOSIS — M7752 Other enthesopathy of left foot: Secondary | ICD-10-CM | POA: Diagnosis not present

## 2022-05-14 DIAGNOSIS — M67472 Ganglion, left ankle and foot: Secondary | ICD-10-CM

## 2022-05-14 MED ORDER — TRIAMCINOLONE ACETONIDE 40 MG/ML IJ SUSP
20.0000 mg | Freq: Once | INTRAMUSCULAR | Status: AC
  Administered 2022-05-14: 20 mg

## 2022-05-14 NOTE — Progress Notes (Signed)
Subjective:  Patient ID: Dawn Mcmillan, female    DOB: 07/30/1976,  MRN: [id number] HPI Chief Complaint  Patient presents with   Foot Pain    5th MPJ left - swollen, numb x few months, aches intermittent, shoes uncomfortable at times, on feet a lot with work, right does bother her at times   New Patient (Initial Visit)    46 y.o. female presents with the above complaint.   ROS: She denies fever chills nausea vomiting muscle aches pains calf pain back pain chest pain shortness of breath.  Past Medical History:  Diagnosis Date   Allergy    AMA (advanced maternal age) multigravida 35+    Anemia    Depression    GERD (gastroesophageal reflux disease)    during pregnancy   H/O varicella    Hyperlipemia    Irritable bowel syndrome    No pertinent past medical history    Past Surgical History:  Procedure Laterality Date   COLONOSCOPY     DILATION AND CURETTAGE OF UTERUS     x 2   PILONIDAL CYST EXCISION  09/21/2001   POLYPECTOMY     WISDOM TOOTH EXTRACTION      Current Outpatient Medications:    Multiple Vitamin (MULTIVITAMIN) capsule, Take 1 capsule by mouth daily., Disp: , Rfl:   No Known Allergies Review of Systems Objective:  There were no vitals filed for this visit.  General: Well developed, nourished, in no acute distress, alert and oriented x3   Dermatological: Skin is warm, dry and supple bilateral. Nails x 10 are well maintained; remaining integument appears unremarkable at this time. There are no open sores, no preulcerative lesions, no rash or signs of infection present.  Vascular: Dorsalis Pedis artery and Posterior Tibial artery pedal pulses are 2/4 bilateral with immedate capillary fill time. Pedal hair growth present. No varicosities and no lower extremity edema present bilateral.   Neruologic: Grossly intact via light touch bilateral. Vibratory intact via tuning fork bilateral. Protective threshold with Semmes Wienstein monofilament intact to all  pedal sites bilateral. Patellar and Achilles deep tendon reflexes 2+ bilateral. No Babinski or clonus noted bilateral.   Musculoskeletal: No gross boney pedal deformities bilateral. No pain, crepitus, or limitation noted with foot and ankle range of motion bilateral. Muscular strength 5/5 in all groups tested bilateral.  She has a small soft tissue mass to the lateral aspect of the fifth metatarsal head left foot visible on radiograph.  Does not demonstrate any type of consolidation.  The joint is not warm red hot or painful.  Gait: Unassisted, Nonantalgic.    Radiographs:  Radiographs taken today demonstrate an osseously mature foot rectus in nature mild osteoarthritic changes of the first metatarsal phalangeal joint very early.  Soft tissue swelling around the fifth metatarsal phalangeal joint.  Assessment & Plan:   Assessment: Capsulitis bursitis possible ganglion fifth metatarsal left foot at the metatarsal phalangeal joint.  Plan: The area was aspirated today after local anesthetic was administered and the skin was prepped and draped in normal sterile fashion.  I was able to remove approximately 1 cc of a bloody serosanguineous fluid did not appear to be ganglion fluid.  At this point I also injected a small amount of Kenalog and I will follow-up with her should this not resolve.  I did instruct her to call me or notify me and then we would consider MRI of the forefoot with contrast without having to see me again.     Phil Corti T.  [place], [place]

## 2022-05-19 ENCOUNTER — Other Ambulatory Visit: Payer: Self-pay

## 2022-05-19 DIAGNOSIS — Z86010 Personal history of colonic polyps: Secondary | ICD-10-CM

## 2022-05-19 DIAGNOSIS — E7800 Pure hypercholesterolemia, unspecified: Secondary | ICD-10-CM

## 2022-05-19 DIAGNOSIS — F419 Anxiety disorder, unspecified: Secondary | ICD-10-CM

## 2022-05-19 DIAGNOSIS — R5383 Other fatigue: Secondary | ICD-10-CM

## 2022-06-23 ENCOUNTER — Other Ambulatory Visit: Payer: BC Managed Care – PPO

## 2022-06-23 DIAGNOSIS — F419 Anxiety disorder, unspecified: Secondary | ICD-10-CM

## 2022-06-23 DIAGNOSIS — K588 Other irritable bowel syndrome: Secondary | ICD-10-CM

## 2022-06-23 DIAGNOSIS — R5383 Other fatigue: Secondary | ICD-10-CM

## 2022-06-23 DIAGNOSIS — E7800 Pure hypercholesterolemia, unspecified: Secondary | ICD-10-CM | POA: Diagnosis not present

## 2022-06-24 LAB — CBC WITH DIFFERENTIAL/PLATELET
Absolute Monocytes: 440 cells/uL (ref 200–950)
Basophils Absolute: 26 cells/uL (ref 0–200)
Basophils Relative: 0.3 %
Eosinophils Absolute: 79 cells/uL (ref 15–500)
Eosinophils Relative: 0.9 %
HCT: 41.9 % (ref 35.0–45.0)
Hemoglobin: 14.3 g/dL (ref 11.7–15.5)
Lymphs Abs: 1927 cells/uL (ref 850–3900)
MCH: 32.9 pg (ref 27.0–33.0)
MCHC: 34.1 g/dL (ref 32.0–36.0)
MCV: 96.5 fL (ref 80.0–100.0)
MPV: 10.1 fL (ref 7.5–12.5)
Monocytes Relative: 5 %
Neutro Abs: 6327 cells/uL (ref 1500–7800)
Neutrophils Relative %: 71.9 %
Platelets: 230 10*3/uL (ref 140–400)
RBC: 4.34 10*6/uL (ref 3.80–5.10)
RDW: 11.8 % (ref 11.0–15.0)
Total Lymphocyte: 21.9 %
WBC: 8.8 10*3/uL (ref 3.8–10.8)

## 2022-06-24 LAB — LIPID PANEL
Cholesterol: 231 mg/dL — ABNORMAL HIGH (ref <200)
HDL: 69 mg/dL (ref >=50)
LDL Cholesterol (Calc): 138 mg/dL (calc) — ABNORMAL HIGH
Non-HDL Cholesterol (Calc): 162 mg/dL (calc) — ABNORMAL HIGH (ref <130)
Total CHOL/HDL Ratio: 3.3 (calc) (ref <5.0)
Triglycerides: 118 mg/dL (ref <150)

## 2022-06-24 LAB — COMPLETE METABOLIC PANEL WITH GFR
AG Ratio: 1.7 (calc) (ref 1.0–2.5)
ALT: 12 U/L (ref 6–29)
AST: 13 U/L (ref 10–35)
Albumin: 4.3 g/dL (ref 3.6–5.1)
Alkaline phosphatase (APISO): 43 U/L (ref 31–125)
BUN: 13 mg/dL (ref 7–25)
CO2: 25 mmol/L (ref 20–32)
Calcium: 9.4 mg/dL (ref 8.6–10.2)
Chloride: 104 mmol/L (ref 98–110)
Creat: 0.95 mg/dL (ref 0.50–0.99)
Globulin: 2.5 g/dL (calc) (ref 1.9–3.7)
Glucose, Bld: 88 mg/dL (ref 65–99)
Potassium: 4.5 mmol/L (ref 3.5–5.3)
Sodium: 137 mmol/L (ref 135–146)
Total Bilirubin: 0.4 mg/dL (ref 0.2–1.2)
Total Protein: 6.8 g/dL (ref 6.1–8.1)
eGFR: 75 mL/min/{1.73_m2} (ref >=60)

## 2022-06-24 LAB — TSH: TSH: 1.3 mIU/L

## 2022-06-26 ENCOUNTER — Encounter: Payer: BC Managed Care – PPO | Admitting: Internal Medicine

## 2022-07-08 ENCOUNTER — Encounter: Payer: Self-pay | Admitting: Internal Medicine

## 2022-07-08 ENCOUNTER — Ambulatory Visit: Payer: BC Managed Care – PPO | Admitting: Internal Medicine

## 2022-07-08 VITALS — BP 104/70 | HR 75 | Temp 97.8°F | Ht 68.25 in | Wt 162.8 lb

## 2022-07-08 DIAGNOSIS — R002 Palpitations: Secondary | ICD-10-CM | POA: Diagnosis not present

## 2022-07-08 DIAGNOSIS — E7800 Pure hypercholesterolemia, unspecified: Secondary | ICD-10-CM | POA: Diagnosis not present

## 2022-07-08 DIAGNOSIS — Z0000 Encounter for general adult medical examination without abnormal findings: Secondary | ICD-10-CM | POA: Diagnosis not present

## 2022-07-08 DIAGNOSIS — Z23 Encounter for immunization: Secondary | ICD-10-CM

## 2022-07-08 DIAGNOSIS — Z860101 Personal history of adenomatous and serrated colon polyps: Secondary | ICD-10-CM

## 2022-07-08 DIAGNOSIS — Z86010 Personal history of colonic polyps: Secondary | ICD-10-CM | POA: Diagnosis not present

## 2022-07-08 LAB — POCT URINALYSIS DIPSTICK
Bilirubin, UA: NEGATIVE
Blood, UA: NEGATIVE
Glucose, UA: NEGATIVE
Ketones, UA: NEGATIVE
Leukocytes, UA: NEGATIVE
Nitrite, UA: NEGATIVE
Protein, UA: NEGATIVE
Spec Grav, UA: 1.01
Urobilinogen, UA: 0.2 U/dL
pH, UA: 6

## 2022-07-08 NOTE — Progress Notes (Signed)
   Subjective:    Patient ID: Dawn Mcmillan, female    DOB: [birth year], 46 y.o.   MRN: [id number]  HPI 46 year old Female seen for health maintenance exam and evaluation of medical issues.   She will have mammogram at GYN.  Colonoscopy done 2022.  Mother has been put on Prolia for osteoporosis. Patient is still having menses that are fairly regular.  Patient has been having issues with palpitations.  She has no chest pain or shortness of breath.  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  She does exercise regularly.  Social history: Married with 2 children.  Family history: Parents with history of hyperlipidemia.  Older brother with hyperlipidemia.  Maternal grandmother with history of dementia.  Father with history of detached retina and hip arthroplasties.  No history of serious illnesses, accidents or operations.  She had colonoscopy in 2017 by Dr. Havery Moros with 5-year follow-up recommended.  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  She had tubular adenomas and a hyperplastic polyp on colonoscopy.  Will be due for tetanus update in 2024.  Flu vaccine given today.  Review of Systems no new complaints other than palpitations.  EKG done today is within normal limits without ectopy.     Objective:   Physical Exam Blood pressure 104/70 pulse 75 temperature 97.8 degrees pulse oximetry 99% weight 162 pounds 12.8 ounces BMI 24.57  Skin: Warm and dry.  No cervical adenopathy or thyromegaly.  No carotid bruits.  Chest clear to auscultation without rales or wheezing.  Cardiac exam: Regular rate and rhythm without ectopy.  Abdomen is soft, nondistended without hepatosplenomegaly masses or tenderness.  GYN exam is deferred to gynecologist.  No lower extremity edema or deformity.  Brief neurological exam is intact without  gross focal deficits.  Affect thought and judgment are normal.  LABs are reviewed.  She has a total cholesterol of 231, HDL of 69, triglycerides of 118 and an LDL cholesterol of 138.  Last year her LDL was 128 and 2 years ago it was 126.  4 years ago it was 126.  She has not wanted to be on statin medication.     Assessment & Plan:   Palpitations-no ectopy noted on EKG or on auscultation.  Suspect this is intermittent and associated with stress  Elevated LDL cholesterol and total cholesterol.  She is a candidate for low-dose statin medication.  She may want to consider coronary calcium scoring but I think the best route for her is to see Cardiologist and she is agreeable to this.  Referral will be made.  Health maintenance-has had colonoscopy.  Flu vaccine given today.  Sees GYN regularly.  Has mammogram through GYN physician.  Plan: She is generally seen here annually.  Return in 1 year or as needed.  Cardiology referral will be placed.

## 2022-07-17 DIAGNOSIS — E7800 Pure hypercholesterolemia, unspecified: Secondary | ICD-10-CM | POA: Insufficient documentation

## 2022-07-17 DIAGNOSIS — R002 Palpitations: Secondary | ICD-10-CM | POA: Insufficient documentation

## 2022-07-28 DIAGNOSIS — Z6824 Body mass index (BMI) 24.0-24.9, adult: Secondary | ICD-10-CM | POA: Diagnosis not present

## 2022-07-28 DIAGNOSIS — Z1231 Encounter for screening mammogram for malignant neoplasm of breast: Secondary | ICD-10-CM | POA: Diagnosis not present

## 2022-07-28 DIAGNOSIS — Z01419 Encounter for gynecological examination (general) (routine) without abnormal findings: Secondary | ICD-10-CM | POA: Diagnosis not present

## 2022-07-28 LAB — HM MAMMOGRAPHY

## 2022-08-24 DIAGNOSIS — M79672 Pain in left foot: Secondary | ICD-10-CM | POA: Diagnosis not present

## 2022-08-24 DIAGNOSIS — M79671 Pain in right foot: Secondary | ICD-10-CM | POA: Diagnosis not present

## 2022-08-24 DIAGNOSIS — M2021 Hallux rigidus, right foot: Secondary | ICD-10-CM | POA: Diagnosis not present

## 2022-08-24 DIAGNOSIS — M21622 Bunionette of left foot: Secondary | ICD-10-CM | POA: Diagnosis not present

## 2022-08-25 ENCOUNTER — Ambulatory Visit (HOSPITAL_BASED_OUTPATIENT_CLINIC_OR_DEPARTMENT_OTHER): Payer: BC Managed Care – PPO | Admitting: Cardiology

## 2022-08-25 ENCOUNTER — Encounter (HOSPITAL_BASED_OUTPATIENT_CLINIC_OR_DEPARTMENT_OTHER): Payer: Self-pay | Admitting: Cardiology

## 2022-08-25 VITALS — BP 118/60 | HR 66 | Ht 68.25 in | Wt 164.0 lb

## 2022-08-25 DIAGNOSIS — R002 Palpitations: Secondary | ICD-10-CM

## 2022-08-25 DIAGNOSIS — Z7189 Other specified counseling: Secondary | ICD-10-CM

## 2022-08-25 DIAGNOSIS — Z8249 Family history of ischemic heart disease and other diseases of the circulatory system: Secondary | ICD-10-CM | POA: Diagnosis not present

## 2022-08-25 NOTE — Progress Notes (Deleted)
The 10-year ASCVD risk score (Arnett DK, et al., 2019) is: 0.6%   Values used to calculate the score:     Age: 46 years     Sex: Female     Is Non-Hispanic African American: No     Diabetic: No     Tobacco smoker: No     Systolic Blood Pressure: 568 mmHg     Is BP treated: No     HDL Cholesterol: 69 mg/dL     Total Cholesterol: 231 mg/dL

## 2022-08-25 NOTE — Progress Notes (Signed)
Cardiology Office Note:    Date:  08/25/2022   ID:  CRAIG WISNEWSKI, DOB 07/12/1976, MRN [id number]  PCP:  Elby Showers, MD   [place] Providers Cardiologist:  Buford Dresser, MD     Referring MD: Elby Showers, MD   CC: new patient consultations for palpitations  History of Present Illness:    Dawn Mcmillan is a 46 y.o. female with a hx of hyperlipidemia seen for consult on chest palpitations at the request of Dr. Tedra Senegal.   She did have a 24 hour Holter monitor that showed normal sinus rhythm with some periods of sinus bradycardia. Note from Dr. Renold Genta from 07/08/22 personally reviewed.  Cardiovascular risk factors: Prior clinical ASCVD: n/a Comorbid conditions:  hyperlipidemia Metabolic syndrome/Obesity:n/a Chronic inflammatory conditions:n/a Tobacco use history:n/a Family history: heart disease on both sides of her family. Her father had a MI at 28 years of age (he is a retired Engineer, drilling). Aunt had an MI at 67 years old. Both had hyperlipidemia and hypertension. Her grandfather on her father's side passed from a MI. Her grandfather on her mothers side had a MI at 26.  Prior pertinent testing and/or incidental findings: 24 hr monitor, normal results  Today, she is concerned due to a family history of heart disease on both sides of her family. She is high stress and high anxiety and will feel her heart racing at times. She also has had some elevated cholesterol and LDL levels that have made her concerned.   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 She denies any syncope but she has had extreme dizziness when these episodes occur.   She denies any chest pain,  shortness of breath, or peripheral edema. No headaches, syncope, orthopnea, or PND.     Past Medical History:  Diagnosis Date   Allergy    AMA (advanced maternal age) multigravida 35+    Anemia    Depression    GERD (gastroesophageal reflux disease)    during pregnancy   H/O varicella    Hyperlipemia    Irritable bowel syndrome    No pertinent past medical history     Past Surgical History:  Procedure Laterality Date   COLONOSCOPY     DILATION AND CURETTAGE OF UTERUS     x 2   PILONIDAL CYST EXCISION  09/21/2001   POLYPECTOMY     WISDOM TOOTH EXTRACTION      Current Medications: No outpatient medications have been marked as taking for the 08/25/22 encounter (Appointment) with Buford Dresser, MD.     Allergies:   Patient has no known allergies.   Social History   Tobacco Use   Smoking status: Former    Types: Cigarettes    Quit date: 03/15/2002    Years since quitting: 20.4   Smokeless tobacco: Never  Vaping Use   Vaping Use: Never used  Substance Use Topics   Alcohol use: Yes    Alcohol/week: 7.0 standard drinks of alcohol    Types: 7 Glasses of wine per week    Comment: dsily wine   Drug use: No    Family History: The patient's family history includes Arthritis in her mother; Breast cancer in her paternal  grandmother; Colon polyps in her brother, maternal grandfather, and mother; Colonic polyp in her maternal grandfather; Diabetes in her maternal grandmother and paternal grandmother; Heart disease in her father and paternal grandfather; Hypertension in her father; Hypothyroidism in her mother; Thrombophlebitis in her maternal grandmother and mother. There is no history of Colon cancer, Esophageal cancer, Pancreatic cancer, Rectal cancer, or Stomach cancer.  ROS:   Please see the history of present illness.    (+) anxiety (+) palpitations (+) dizziness  All other systems reviewed and are negative.  EKGs/Labs/Other Studies Reviewed:    The following  studies were reviewed today:  Holter Monitor 24 Hr 11/07/2018:  Sinus rhythm with sinus bradycardia No supraventricular tachycardia, premature atrial or ventricular beats. No atrial fibrillation or flutter.  Artifact  EKG: EKG is personally reviewed.  08/25/2022: NSR at 66 bpm  Recent Labs: 06/23/2022: ALT 12; BUN 13; Creat 0.95; Hemoglobin 14.3; Platelets 230; Potassium 4.5; Sodium 137; TSH 1.30  Recent Lipid Panel    Component Value Date/Time   CHOL 231 (H) 06/23/2022 1215   TRIG 118 06/23/2022 1215   HDL 69 06/23/2022 1215   CHOLHDL 3.3 06/23/2022 1215   VLDL 16 10/06/2016 1131   LDLCALC 138 (H) 06/23/2022 1215   Physical Exam:    VS:  BP 118/60   Pulse 66   Ht 5' 8.25" (1.734 m)   Wt 164 lb (74.4 kg)   BMI 24.75 kg/m     Wt Readings from Last 3 Encounters:  08/25/22 164 lb (74.4 kg)  07/08/22 162 lb 12.8 oz (73.8 kg)  07/09/21 153 lb (69.4 kg)    GEN: Well nourished, well developed in no acute distress HEENT: Normal, moist mucous membranes NECK: No JVD CARDIAC: regular rhythm, normal S1 and S2, no rubs or gallops. No murmur. VASCULAR: Radial and DP pulses 2+ bilaterally. No carotid bruits RESPIRATORY:  Clear to auscultation without rales, wheezing or rhonchi  ABDOMEN: Soft, non-tender, non-distended MUSCULOSKELETAL:  Ambulates independently SKIN: Warm and dry, no edema NEUROLOGIC:  Alert and oriented x 3. No focal neuro deficits noted. PSYCHIATRIC:  Normal affect    ASSESSMENT:    1. Heart palpitations   2. Family history of heart disease   3. Cardiac risk counseling   4. Counseling on health promotion and disease prevention    PLAN:    Palpitations -we discussed the potential causes of fast heart rates and palpitations today. Reviewed the normal electrical system of the heart. Reviewed the role of the sinus node. Reviewed the balance between resting (vagal) tone and fight or flight nervous system input. Reviewed how exercise improves vagal tone and lowers  resting heart rate. Reviewed that sinus tachycardia, or elevated sinus rate, is usually secondary to something else in the body. This can include pain, stress, infection, anxiety, hormone imbalance, low blood counts, etc. Discussed that we do not typically treat sinus tachycardia by itself, and instead the focus is on finding what is driving the heart rate and treating that. We discussed that there can be other rhythm issues, from either the top or bottom of the heart, that are abnormal rhythms. Discussed how we evaluate for these.  -we discussed both repeating a monitor (2 weeks) vs. Monitoring intermittently. For now we will monitor intermittently, discussed Kardiamobile. If symptoms become more frequent/aggravating, will pursue monitor  Family history of heart disease -we reviewed the pros/cons of calcium scores. A cardiac CT scan for coronary calcium is a non-invasive way of obtaining information about the presence, location and  extent of calcified plaque in the coronary arteries--the vessels that supply oxygen-containing blood to the heart muscle. Calcified plaque results when there is a build-up of fat and other substances under the inner layer of the artery. This material can calcify which signals the presence of atherosclerosis, a disease of the vessel wall, also called coronary artery disease.  People with this disease have an increased risk for heart attacks. In addition, over time, progression of plaque build up (CAD) can narrow the arteries or even close off blood flow to the heart. Because calcium is a marker of CAD, the amount of calcium detected on a cardiac CT scan is a helpful prognostic tool.  -we reviewed the charts together which show the relationship between calcium score and 15 year all cause mortality -after shared decision making, will proceed with coronary calcium score. They understand this is an out of pocket/self pay test currently costing $99. -if calcium score nonzero, we did  preliminarily discuss statin and the data for benefit   CV risk counseling and prevention -recommend heart healthy/Mediterranean diet, with whole grains, fruits, vegetable, fish, lean meats, nuts, and olive oil. Limit salt. -recommend moderate walking, 3-5 times/week for 30-50 minutes each session. Aim for at least 150 minutes.week. Goal should be pace of 3 miles/hours, or walking 1.5 miles in 30 minutes -recommend avoidance of tobacco products. Avoid excess alcohol. -ASCVD risk score: The 10-year ASCVD risk score (Arnett DK, et al., 2019) is: 0.6%   Values used to calculate the score:     Age: 35 years     Sex: Female     Is Non-Hispanic African American: No     Diabetic: No     Tobacco smoker: No     Systolic Blood Pressure: 976 mmHg     Is BP treated: No     HDL Cholesterol: 69 mg/dL     Total Cholesterol: 231 mg/dL    Follow up: I would be happy to see her back as needed  Buford Dresser, MD, PhD, [facility] at [facility] at [facility] [address], [place], [place] [address] [phone]   Medication Adjustments/Labs and Tests Ordered: Current medicines are reviewed at length with the patient today.  Concerns regarding medicines are outlined above.  Orders Placed This Encounter  Procedures   CT CARDIAC SCORING (SELF PAY ONLY)   EKG 12-Lead   No orders of the defined types were placed in this encounter.   Patient Instructions  Medication Instructions:  Your Physician recommend you continue on your current medication as directed.    *If you need a refill on your cardiac medications before your next appointment, please call your pharmacy*   Testing/Procedures: Your physician has recommend you to have a coronary calcium score. This is a self pay test that will cost $99   Follow-Up: At [facility], you and your health needs are our priority.  As part of our  continuing mission to provide you with exceptional heart care, we have created designated Provider Care Teams.  These Care Teams include your primary Cardiologist (physician) and Advanced Practice Providers (APPs -  Physician Assistants and Nurse Practitioners) who all work together to provide you with the care you need, when you need it.  We recommend signing up for the patient portal called "MyChart".  Sign up information is provided on this After Visit Summary.  MyChart is used to connect with patients for Virtual  Visits (Telemedicine).  Patients are able to view lab/test results, encounter notes, upcoming appointments, etc.  Non-urgent messages can be sent to your provider as well.   To learn more about what you can do with MyChart, go to [email or web address].    Your next appointment:   Call us if you need [place]!   Other Instructions Kardia for monitoring of EKGs at home: You can look into the [facility] device by [employer].This device is purchased by you and it connects to an application you download to your smart phone.  It can detect abnormal heart rhythms and alert you to contact your doctor for further evaluation. The device is approximately $90 and the phone application is free.  The web site is:  https://www.alivecor.com             I,Jessica Ford,acting as a Education administrator for [employer], MD.,have documented all relevant documentation on the behalf of Buford Dresser, MD,as directed by  Buford Dresser, MD while in the presence of Buford Dresser, MD.   I, Buford Dresser, MD, have reviewed all documentation for this visit. The documentation on 09/03/22 for the exam, diagnosis, procedures, and orders are all accurate and complete.   Signed, Buford Dresser, MD  08/25/2022     [place]

## 2022-08-25 NOTE — Patient Instructions (Signed)
Medication Instructions:  Your Physician recommend you continue on your current medication as directed.    *If you need a refill on your cardiac medications before your next appointment, please call your pharmacy*   Testing/Procedures: Your physician has recommend you to have a coronary calcium score. This is a self pay test that will cost $99   Follow-Up: At [facility], you and your health needs are our priority.  As part of our continuing mission to provide you with exceptional heart care, we have created designated Provider Care Teams.  These Care Teams include your primary Cardiologist (physician) and Advanced Practice Providers (APPs -  Physician Assistants and Nurse Practitioners) who all work together to provide you with the care you need, when you need it.  We recommend signing up for the patient portal called "MyChart".  Sign up information is provided on this After Visit Summary.  MyChart is used to connect with patients for Virtual Visits (Telemedicine).  Patients are able to view lab/test results, encounter notes, upcoming appointments, etc.  Non-urgent messages can be sent to your provider as well.   To learn more about what you can do with MyChart, go to [email or web address].    Your next appointment:   Call us if you need [place]!   Other Instructions Kardia for monitoring of EKGs at home: You can look into the [facility] device by [employer].This device is purchased by you and it connects to an application you download to your smart phone.  It can detect abnormal heart rhythms and alert you to contact your doctor for further evaluation. The device is approximately $90 and the phone application is free.  The web site is:  https://www.alivecor.com

## 2022-09-25 DIAGNOSIS — D225 Melanocytic nevi of trunk: Secondary | ICD-10-CM | POA: Diagnosis not present

## 2022-09-25 DIAGNOSIS — D1721 Benign lipomatous neoplasm of skin and subcutaneous tissue of right arm: Secondary | ICD-10-CM | POA: Diagnosis not present

## 2022-09-25 DIAGNOSIS — D220 Melanocytic nevi of lip: Secondary | ICD-10-CM | POA: Diagnosis not present

## 2022-09-25 DIAGNOSIS — L814 Other melanin hyperpigmentation: Secondary | ICD-10-CM | POA: Diagnosis not present

## 2022-10-20 ENCOUNTER — Encounter (HOSPITAL_BASED_OUTPATIENT_CLINIC_OR_DEPARTMENT_OTHER): Payer: Self-pay

## 2022-10-20 ENCOUNTER — Ambulatory Visit (HOSPITAL_BASED_OUTPATIENT_CLINIC_OR_DEPARTMENT_OTHER)
Admission: RE | Admit: 2022-10-20 | Discharge: 2022-10-20 | Disposition: A | Discharge status: Home / Self Care | Payer: BC Managed Care – PPO | Source: Ambulatory Visit | Attending: Cardiology | Admitting: Cardiology

## 2022-10-20 DIAGNOSIS — Z8249 Family history of ischemic heart disease and other diseases of the circulatory system: Secondary | ICD-10-CM | POA: Insufficient documentation

## 2023-04-09 ENCOUNTER — Other Ambulatory Visit: Payer: Self-pay

## 2023-04-09 ENCOUNTER — Encounter (HOSPITAL_BASED_OUTPATIENT_CLINIC_OR_DEPARTMENT_OTHER): Payer: Self-pay

## 2023-04-09 ENCOUNTER — Emergency Department (HOSPITAL_BASED_OUTPATIENT_CLINIC_OR_DEPARTMENT_OTHER)
Admission: EM | Admit: 2023-04-09 | Discharge: 2023-04-09 | Disposition: A | Discharge status: Home / Self Care | Payer: BC Managed Care – PPO | Attending: Emergency Medicine | Admitting: Emergency Medicine

## 2023-04-09 ENCOUNTER — Emergency Department (HOSPITAL_BASED_OUTPATIENT_CLINIC_OR_DEPARTMENT_OTHER): Payer: BC Managed Care – PPO

## 2023-04-09 DIAGNOSIS — S99911A Unspecified injury of right ankle, initial encounter: Secondary | ICD-10-CM | POA: Diagnosis not present

## 2023-04-09 DIAGNOSIS — M79671 Pain in right foot: Secondary | ICD-10-CM | POA: Insufficient documentation

## 2023-04-09 DIAGNOSIS — Y9366 Activity, soccer: Secondary | ICD-10-CM | POA: Insufficient documentation

## 2023-04-09 DIAGNOSIS — X500XXA Overexertion from strenuous movement or load, initial encounter: Secondary | ICD-10-CM | POA: Diagnosis not present

## 2023-04-09 DIAGNOSIS — S99811A Other specified injuries of right ankle, initial encounter: Secondary | ICD-10-CM | POA: Diagnosis not present

## 2023-04-09 DIAGNOSIS — M25571 Pain in right ankle and joints of right foot: Secondary | ICD-10-CM | POA: Diagnosis not present

## 2023-04-09 DIAGNOSIS — M19071 Primary osteoarthritis, right ankle and foot: Secondary | ICD-10-CM | POA: Diagnosis not present

## 2023-04-09 LAB — PREGNANCY, URINE: Preg Test, Ur: NEGATIVE

## 2023-04-09 MED ORDER — IBUPROFEN 400 MG PO TABS
600.0000 mg | ORAL_TABLET | Freq: Once | ORAL | Status: AC
  Administered 2023-04-09: 600 mg via ORAL
  Filled 2023-04-09: qty 1

## 2023-04-09 MED ORDER — ACETAMINOPHEN 500 MG PO TABS
1000.0000 mg | ORAL_TABLET | Freq: Once | ORAL | Status: AC
  Administered 2023-04-09: 1000 mg via ORAL
  Filled 2023-04-09: qty 2

## 2023-04-09 MED ORDER — IBUPROFEN 600 MG PO TABS: 600.0000 mg | ORAL_TABLET | Freq: Four times a day (QID) | ORAL | 0 refills | Status: AC | PRN

## 2023-04-09 MED ORDER — OXYCODONE HCL 5 MG PO TABS
5.0000 mg | ORAL_TABLET | ORAL | Status: AC
  Administered 2023-04-09: 5 mg via ORAL
  Filled 2023-04-09: qty 1

## 2023-04-09 MED ORDER — OXYCODONE HCL 5 MG PO TABS: 5.0000 mg | ORAL_TABLET | ORAL | 0 refills | Status: DC | PRN

## 2023-04-09 NOTE — ED Notes (Addendum)
Pt willing to received crutches as the ones from home were too short

## 2023-04-09 NOTE — ED Notes (Signed)
Pt refused crutches due to having some at home she can use.

## 2023-04-09 NOTE — ED Triage Notes (Signed)
Patient rolled her ankle playing soccer today, initially had minimal pain, now pain has increased.

## 2023-04-09 NOTE — Discharge Instructions (Addendum)
You were seen in the ER today for evaluation of your ankle pain.  Your x-ray imaging did not show any acute abnormality.  I would like for you to follow-up within the next few days with an orthopedic provider.  We have given you a cam boot, please leave this on and I would remain nonweightbearing status and use your crutches to get around.  For pain, you can take Tylenol 1000 mg and/or ibuprofen 600 mg every 6 hours as needed for pain. I have sent you home with a few narcotic pain medications. Please take for breakthrough pain. Do no drive or operate any heavy machinery as these can make you sleepy. I have included more information on ankle pain and walking boot into the discharge paperwork.  Please review.  If you have any concerns, new or worsening symptoms, please return to your nearest emergency department for reevaluation.  Contact a health care provider if: Your pain gets worse. Your pain does not get better with medicine. You have a fever or chills. You have more trouble walking. You have new symptoms. Your foot, leg, toes, or ankle tingles, becomes numb or swollen, or turns cold and blue.

## 2023-04-09 NOTE — ED Provider Notes (Signed)
[place] EM[facility] DEPARTMENT AT [facility] Provider Note   CSN: [id number] Arrival date & time: 04/09/23  1908     History Chief Complaint  Patient presents with   Ankle Injury    Dawn Mcmillan is a 47 y.o. female presents to the ER for evaluation of right ankle pain. The patient reports that she was in a parent child soccer game and she went to kick the ball with the top of her right foot and felt that her ankle everted. She reports that she was able to play afterwards but did have some pain. She reports that when she got home, it started to hurt worse, but when they went out to eat, she felt a throbbing pain in her ankle. No pain medication trialed prior to arrival. Denies any numbness or tingling. NKDA.   Ankle Injury       Home Medications Prior to Admission medications   Medication Sig Start Date End Date Taking? Authorizing Provider  Multiple Vitamin (MULTIVITAMIN) capsule Take 1 capsule by mouth daily.    [provider]      Allergies    Patient has no known allergies.    Review of Systems   Review of Systems  Constitutional:  Negative for chills and fever.  Musculoskeletal:  Positive for arthralgias.    Physical Exam Updated Vital Signs BP 122/82 (BP Location: Left Arm)   Pulse 83   Temp 98.2 F (36.8 C) (Oral)   Resp 16   Ht 5\' 8"  (1.727 m)   Wt 70.3 kg   LMP 03/22/2023   SpO2 98%   BMI 23.57 kg/m  Physical Exam Vitals and nursing note reviewed.  Constitutional:      General: She is not in acute distress.    Appearance: Normal appearance. She is not ill-appearing or toxic-appearing.  Eyes:     General: No scleral icterus. Pulmonary:     Effort: Pulmonary effort is normal. No respiratory distress.  Musculoskeletal:        General: Tenderness present.     Right lower leg: No edema.     Left lower leg: No edema.       Feet:  Feet:     Comments: Pain just posterior to the lateral and medial malleoli.  She also has  pain more to the anterior dorsum of the foot.  See please image above.  Some swelling noted more to the lateral malleoli.  No tenderness into the lower leg.  Does have pain with flexion extension of the ankle.  No pain upon palpation of the calf.  Achilles tendon appears to be intact.  Compartments are soft.  Brisk cap refill in all 5 toes.  Palpable DP and PT pulses.  No discoloration or overlying abrasions or lacerations noted. Skin:    General: Skin is dry.  Neurological:     General: No focal deficit present.     Mental Status: She is alert. Mental status is at baseline.     ED Results / Procedures / Treatments   Labs (all labs ordered are listed, but only abnormal results are displayed) Labs Reviewed  PREGNANCY, URINE    EKG None  Radiology DG Foot Complete Right  Result Date: 04/09/2023 CLINICAL DATA:  Pain. EXAM: RIGHT FOOT COMPLETE - 3 VIEW COMPARISON:  None Available. FINDINGS: There is no evidence of fracture or dislocation. First metatarsophalangeal degenerative changes identified with sclerosis, joint space narrowing and osteophytes. Joint spaces are otherwise well-maintained. Soft tissues are unremarkable. IMPRESSION:  First MTP degenerative changes.  No acute osseous abnormalities. Electronically Signed   By: Layla Maw M.D.   On: 04/09/2023 20:36   DG Ankle Right Port  Addendum Date: 04/09/2023   ADDENDUM REPORT: 04/09/2023 20:24 ADDENDUM: Study was reviewed with additional clinical information from the treating provider. Apparently the patient's symptoms are in the lateral aspect. No definite fracture line could be demonstrated. If there are continued symptoms, repeat radiographs of right ankle may be considered in 2 weeks. This recommendation was relayed to the treating provider by telephone call. Electronically Signed   By: Ernie Avena M.D.   On: 04/09/2023 20:24   Result Date: 04/09/2023 CLINICAL DATA:  Trauma, pain EXAM: PORTABLE RIGHT ANKLE - 2 VIEW  COMPARISON:  None Available. FINDINGS: There is no evidence of fracture, dislocation, or joint effusion. There is no evidence of arthropathy or other focal bone abnormality. Soft tissues are unremarkable. IMPRESSION: No displaced fracture or dislocation is seen in right ankle. Electronically Signed: By: Ernie Avena M.D. On: 04/09/2023 19:58    Procedures Procedures   Medications Ordered in ED Medications - No data to display  ED Course/ Medical Decision Making/ A&P                            Medical Decision Making Amount and/or Complexity of Data Reviewed Labs: ordered. Radiology: ordered.  Risk OTC drugs. Prescription drug management.   47 y.o. female presents to the ER for evaluation of right ankle pain. Differential diagnosis includes but is not limited to sprain, strain, fracture, tendon ligament damage, contusion. Vital signs unremarkable. Physical exam as noted above.   I independently reviewed and interpreted the patient's labs.  Pregnancy test negative.  X-ray imaging read as No displaced fracture or dislocation is seen in right ankle. First MTP degenerative changes.  No acute osseous abnormalities of the foot. All per radiology read.  Radiologist recommended reevaluation x-ray in 2 weeks.  Patient does have some tenderness to the more lateral aspect of the ankle.  I do not appreciate any significant deformity.  She is neurovascular intact distally.  Will apply cam boot and recommend nonweightbearing status with crutches until she is followed up with orthopedics.  She reports that her family sees Delbert Harness and she would like to see them.  And is reasonable.  Information for Dewaine Conger included in the discharge paperwork.  Will recommend Tylenol ibuprofen as needed for pain as well as the RICE method. Small amount of narcotic pain medication given as well. PDMP checked.   We discussed the results of the labs/imaging. The plan is take medication as prescribed.  Follow up with orthopedics. Patient requested Delbert Harness. We discussed strict return precautions and red flag symptoms. The patient verbalized their understanding and agrees to the plan. The patient is stable and being discharged home in good condition.  Portions of this report may have been transcribed using voice recognition software. Every effort was made to ensure accuracy; however, inadvertent computerized transcription errors may be present.   Final Clinical Impression(s) / ED Diagnoses Final diagnoses:  Acute right ankle pain  Right foot pain    Rx / DC Orders ED Discharge Orders          Ordered    oxyCODONE (ROXICODONE) 5 MG immediate release tablet  Every 4 hours PRN        04/09/23 2217    ibuprofen (ADVIL) 600 MG tablet  Every 6 hours PRN  04/09/23 2217              Achille Rich, PA-C 04/10/23 0124    Vanetta Mulders, MD 04/12/23 [phone]

## 2023-06-30 ENCOUNTER — Telehealth: Payer: Self-pay | Admitting: Internal Medicine

## 2023-06-30 NOTE — Telephone Encounter (Signed)
LVM to CB to change appointment, DR Lenord Fellers will be out of office, need to re schedule CPE to different day.

## 2023-07-01 NOTE — Telephone Encounter (Signed)
reschedule

## 2023-07-08 ENCOUNTER — Other Ambulatory Visit: Payer: BC Managed Care – PPO

## 2023-07-08 DIAGNOSIS — E7800 Pure hypercholesterolemia, unspecified: Secondary | ICD-10-CM

## 2023-07-08 DIAGNOSIS — Z1329 Encounter for screening for other suspected endocrine disorder: Secondary | ICD-10-CM | POA: Diagnosis not present

## 2023-07-08 DIAGNOSIS — Z0000 Encounter for general adult medical examination without abnormal findings: Secondary | ICD-10-CM | POA: Diagnosis not present

## 2023-07-09 LAB — CBC WITH DIFFERENTIAL/PLATELET
Absolute Lymphocytes: 1730 {cells}/uL (ref 850–3900)
Absolute Monocytes: 508 {cells}/uL (ref 200–950)
Basophils Absolute: 47 {cells}/uL (ref 0–200)
Basophils Relative: 0.5 %
Eosinophils Absolute: 113 {cells}/uL (ref 15–500)
Eosinophils Relative: 1.2 %
HCT: 42.3 % (ref 35.0–45.0)
Hemoglobin: 14.2 g/dL (ref 11.7–15.5)
MCH: 31.9 pg (ref 27.0–33.0)
MCHC: 33.6 g/dL (ref 32.0–36.0)
MCV: 95.1 fL (ref 80.0–100.0)
MPV: 9.8 fL (ref 7.5–12.5)
Monocytes Relative: 5.4 %
Neutro Abs: 7003 {cells}/uL (ref 1500–7800)
Neutrophils Relative %: 74.5 %
Platelets: 286 10*3/uL (ref 140–400)
RBC: 4.45 10*6/uL (ref 3.80–5.10)
RDW: 11.9 % (ref 11.0–15.0)
Total Lymphocyte: 18.4 %
WBC: 9.4 10*3/uL (ref 3.8–10.8)

## 2023-07-09 LAB — COMPLETE METABOLIC PANEL WITH GFR
AG Ratio: 2.1 (calc) (ref 1.0–2.5)
ALT: 14 U/L (ref 6–29)
AST: 16 U/L (ref 10–35)
Albumin: 4.6 g/dL (ref 3.6–5.1)
Alkaline phosphatase (APISO): 53 U/L (ref 31–125)
BUN: 12 mg/dL (ref 7–25)
CO2: 25 mmol/L (ref 20–32)
Calcium: 9.4 mg/dL (ref 8.6–10.2)
Chloride: 102 mmol/L (ref 98–110)
Creat: 0.96 mg/dL (ref 0.50–0.99)
Globulin: 2.2 g/dL (ref 1.9–3.7)
Glucose, Bld: 88 mg/dL (ref 65–99)
Potassium: 4.4 mmol/L (ref 3.5–5.3)
Sodium: 137 mmol/L (ref 135–146)
Total Bilirubin: 0.5 mg/dL (ref 0.2–1.2)
Total Protein: 6.8 g/dL (ref 6.1–8.1)
eGFR: 74 mL/min/{1.73_m2} (ref >=60)

## 2023-07-09 LAB — TSH: TSH: 1.04 m[IU]/L

## 2023-07-09 LAB — LIPID PANEL
Cholesterol: 221 mg/dL — ABNORMAL HIGH (ref <200)
HDL: 77 mg/dL (ref >=50)
LDL Cholesterol (Calc): 125 mg/dL — ABNORMAL HIGH
Non-HDL Cholesterol (Calc): 144 mg/dL — ABNORMAL HIGH (ref <130)
Total CHOL/HDL Ratio: 2.9 (calc) (ref <5.0)
Triglycerides: 93 mg/dL (ref <150)

## 2023-07-09 NOTE — Progress Notes (Signed)
Patient Care Team: Margaree Mackintosh, MD as PCP - General (Internal Medicine) Jodelle Red, MD as PCP - Cardiology (Cardiology)  Visit Date: 07/16/23  Subjective:    Patient ID: Dawn Mcmillan , Female   DOB: August 05, 1976, 47 y.o.    MRN: [id number]   47 y.o. Female presents today for annual comprehensive physical exam. History of allergies, anemia, depression, GERD, hyperlipidemia, IBS.  History of hyperlipidemia currently diet and exercise controlled. CHOL elevated at 221, LDL elevated at 125 on 07/08/23, down from 231 and 138 one year ago.  She has been having hot flashes, irritability, intermittent weight gain, heavy and short menses and wonders if she has started perimenopause.  Her mother recently started Prolia injections and she is wondering if she should have a bone density scan. This was discussed and she is agreeable to having her first screening at age 67.  Seen in [facility] ED on 04/09/23 for right ankle injury received during a soccer game when kicking the ball. 04/09/23 right ankle X-ray showed no displaced fracture or dislocation, first MTP degenerative changes, no acute osseous abnormalities of the foot. Discharged with Roxicodone.  Patient has been having issues with palpitations.  She has no chest pain or shortness of breath.  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  She does exercise regularly. Seen by cardiologist, Dr. Jodelle Red.  No history of serious illnesses, accidents or operations.   Labs reviewed today. Glucose normal. Kidney, liver functions normal. Electrolytes normal. Blood proteins normal. CBC normal. TSH at 1.04.  Takes a multivitamin.  Due for mammogram.  Colonoscopy done 2022.  Social history: Married with 2 children.   Family history: Parents with history of hyperlipidemia.  Older brother with hyperlipidemia.  Maternal  grandmother with history of dementia.  Father with history of detached retina and hip arthroplasties.  Past Medical History:  Diagnosis Date   Allergy    AMA (advanced maternal age) multigravida 35+    Anemia    Depression    GERD (gastroesophageal reflux disease)    during pregnancy   H/O varicella    Hyperlipemia    Irritable bowel syndrome    No pertinent past medical history      Family History  Problem Relation Age of Onset   Arthritis Mother    Thrombophlebitis Mother    Hypothyroidism Mother    Colon polyps Mother    Heart disease Father    Hypertension Father    Colon polyps Brother    Thrombophlebitis Maternal Grandmother    Diabetes Maternal Grandmother    Colon polyps Maternal Grandfather    Colonic polyp Maternal Grandfather    Diabetes Paternal Grandmother    Breast cancer Paternal Grandmother    Heart disease Paternal Grandfather        blocked arteries   Colon cancer Neg Hx    Esophageal cancer Neg Hx    Pancreatic cancer Neg Hx    Rectal cancer Neg Hx    Stomach cancer Neg Hx     Social Hx: Married with 2 children.  Works full-time and also owns and  operates an Forensic scientist.  Non-smoker.     Review of Systems  Constitutional:  Negative for chills, fever, malaise/fatigue and weight loss.  HENT:  Negative for hearing loss, sinus pain and sore throat.   Respiratory:  Negative for cough, hemoptysis and shortness of breath.  Cardiovascular:  Negative for chest pain, palpitations, leg swelling and PND.  Gastrointestinal:  Negative for abdominal pain, constipation, diarrhea, heartburn, nausea and vomiting.  Genitourinary:  Negative for dysuria, frequency and urgency.  Musculoskeletal:  Negative for back pain, myalgias and neck pain.  Skin:  Negative for itching and rash.  Neurological:  Negative for dizziness, tingling, seizures and headaches.  Endo/Heme/Allergies:  Negative for polydipsia.  Psychiatric/Behavioral:  Negative for depression. The  patient is not nervous/anxious.         Objective:   Vitals: BP 110/80   Pulse 80   Ht 5' 8.25" (1.734 m)   Wt 161 lb (73 kg)   LMP 06/16/2023   SpO2 98%   BMI 24.30 kg/m    Physical Exam Vitals and nursing note reviewed.  Constitutional:      General: She is not in acute distress.    Appearance: Normal appearance. She is not ill-appearing or toxic-appearing.  HENT:     Head: Normocephalic and atraumatic.     Right Ear: Hearing, tympanic membrane, ear canal and external ear normal.     Left Ear: Hearing, tympanic membrane, ear canal and external ear normal.     Mouth/Throat:     Pharynx: Oropharynx is clear.  Eyes:     Extraocular Movements: Extraocular movements intact.     Pupils: Pupils are equal, round, and reactive to light.  Neck:     Thyroid: No thyroid mass, thyromegaly or thyroid tenderness.     Vascular: No carotid bruit.  Cardiovascular:     Rate and Rhythm: Normal rate and regular rhythm. No extrasystoles are present.    Pulses:          Dorsalis pedis pulses are 1+ on the right side and 1+ on the left side.     Heart sounds: Normal heart sounds. No murmur heard.    No friction rub. No gallop.  Pulmonary:     Effort: Pulmonary effort is normal.     Breath sounds: Normal breath sounds. No decreased breath sounds, wheezing, rhonchi or rales.  Chest:     Chest wall: No mass.  Breasts:    Right: No mass.     Left: No mass.  Abdominal:     Palpations: Abdomen is soft. There is no hepatomegaly, splenomegaly or mass.     Tenderness: There is no abdominal tenderness.     Hernia: No hernia is present.  Musculoskeletal:     Cervical back: Normal range of motion.     Right lower leg: No edema.     Left lower leg: No edema.  Lymphadenopathy:     Cervical: No cervical adenopathy.     Upper Body:     Right upper body: No supraclavicular adenopathy.     Left upper body: No supraclavicular adenopathy.  Skin:    General: Skin is warm and dry.  Neurological:      General: No focal deficit present.     Mental Status: She is alert and oriented to person, place, and time. Mental status is at baseline.     Sensory: Sensation is intact.     Motor: Motor function is intact. No weakness.     Deep Tendon Reflexes: Reflexes are normal and symmetric.  Psychiatric:        Attention and Perception: Attention normal.        Mood and Affect: Mood normal.        Speech: Speech normal.        Behavior:  Behavior normal.        Thought Content: Thought content normal.        Cognition and Memory: Cognition normal.        Judgment: Judgment normal.       Results:   Studies obtained and personally reviewed by me:  Colonoscopy done 2022.  Labs:       Component Value Date/Time   NA 137 07/08/2023 1157   K 4.4 07/08/2023 1157   CL 102 07/08/2023 1157   CO2 25 07/08/2023 1157   GLUCOSE 88 07/08/2023 1157   BUN 12 07/08/2023 1157   CREATININE 0.96 07/08/2023 1157   CALCIUM 9.4 07/08/2023 1157   PROT 6.8 07/08/2023 1157   ALBUMIN 4.1 10/06/2016 1131   AST 16 07/08/2023 1157   ALT 14 07/08/2023 1157   ALKPHOS 38 10/06/2016 1131   BILITOT 0.5 07/08/2023 1157   GFRNONAA 63 03/27/2021 0939   GFRAA 73 03/27/2021 0939     Lab Results  Component Value Date   WBC 9.4 07/08/2023   HGB 14.2 07/08/2023   HCT 42.3 07/08/2023   MCV 95.1 07/08/2023   PLT 286 07/08/2023    Lab Results  Component Value Date   CHOL 221 (H) 07/08/2023   HDL 77 07/08/2023   LDLCALC 125 (H) 07/08/2023   TRIG 93 07/08/2023   CHOLHDL 2.9 07/08/2023    No results found for: "HGBA1C"   Lab Results  Component Value Date   TSH 1.04 07/08/2023      Assessment & Plan:   Perimenopausal symptoms: she has been having hot flashes, irritability, intermittent weight gain, heavy and short menses. She will discuss this with her GYN physician.  May need to start hormonal therapy but would like GYN physician to decide which preparation best suits patient.  Hyperlipidemia:  currently diet and exercise controlled. CHOL elevated at 221, LDL elevated at 125 on 07/08/23, down from 231 and 138 one year ago.  Palpitations: She was seen by by Cardiologist, Dr. Jodelle Red in December 2023.  Still has these on occasion and these are intermittent.  No treatment was thought to be necessary by Dr. Cristal Deer.  Patient counseled regarding medications that can cause palpitations such as decongestants.  Caffeine can cause palpitations and sometimes alcohol.  Coronary calcium score was 0 .  A Kardia mobile device was discussed by Dr. Cristal Deer with the patient.  No history of serious illnesses, accidents or operations.   Last mammogram was 2021. Due for repeat.  Pelvic exam deferred to GYN physician.   Urinalysis normal today.  Colonoscopy done 2022.  Vaccine counseling: due for flu, tetanus boosters. Declined Covid-19 vaccines.   Return in 1 year or as needed.    I,Alexander Ruley,acting as a Neurosurgeon for Margaree Mackintosh, MD.,have documented all relevant documentation on the behalf of Margaree Mackintosh, MD,as directed by  Margaree Mackintosh, MD while in the presence of Margaree Mackintosh, MD.   I, Margaree Mackintosh, MD, have reviewed all documentation for this visit. The documentation on 07/21/23 for the exam, diagnosis, procedures, and orders are all accurate and complete.

## 2023-07-12 ENCOUNTER — Encounter: Payer: BC Managed Care – PPO | Admitting: Internal Medicine

## 2023-07-16 ENCOUNTER — Ambulatory Visit (INDEPENDENT_AMBULATORY_CARE_PROVIDER_SITE_OTHER): Payer: BC Managed Care – PPO | Admitting: Internal Medicine

## 2023-07-16 ENCOUNTER — Encounter: Payer: Self-pay | Admitting: Internal Medicine

## 2023-07-16 VITALS — BP 110/80 | HR 80 | Ht 68.25 in | Wt 161.0 lb

## 2023-07-16 DIAGNOSIS — N951 Menopausal and female climacteric states: Secondary | ICD-10-CM | POA: Diagnosis not present

## 2023-07-16 DIAGNOSIS — R002 Palpitations: Secondary | ICD-10-CM | POA: Diagnosis not present

## 2023-07-16 DIAGNOSIS — E7800 Pure hypercholesterolemia, unspecified: Secondary | ICD-10-CM | POA: Diagnosis not present

## 2023-07-16 DIAGNOSIS — Z0000 Encounter for general adult medical examination without abnormal findings: Secondary | ICD-10-CM | POA: Diagnosis not present

## 2023-07-16 DIAGNOSIS — Z860101 Personal history of adenomatous and serrated colon polyps: Secondary | ICD-10-CM

## 2023-07-16 DIAGNOSIS — Z124 Encounter for screening for malignant neoplasm of cervix: Secondary | ICD-10-CM

## 2023-07-16 LAB — POCT URINALYSIS DIP (CLINITEK)
Bilirubin, UA: NEGATIVE
Blood, UA: NEGATIVE
Glucose, UA: NEGATIVE mg/dL
Ketones, POC UA: NEGATIVE mg/dL
Leukocytes, UA: NEGATIVE
Nitrite, UA: NEGATIVE
POC PROTEIN,UA: NEGATIVE
Spec Grav, UA: 1.015 (ref 1.010–1.025)
Urobilinogen, UA: 0.2 U/dL
pH, UA: 6.5 (ref 5.0–8.0)

## 2023-07-21 ENCOUNTER — Encounter: Payer: Self-pay | Admitting: Internal Medicine

## 2023-07-21 NOTE — Patient Instructions (Signed)
It was a pleasure to see you today.  Symptoms of perimenopause discussed.  Suggest seeing GYN physician regarding hormonal therapy as GYN would likely be the best person to decide which preparation is best for you.  Patient declines lipid lowering medication.  LDL elevated at 125.  Had coronary calcium score of 0 which was reassuring and she likely does not need statin medication at this time.  Colonoscopy is up-to-date.  Due for repeat mammogram.  Vaccines discussed.  Return in 1 year or as needed.

## 2023-08-17 DIAGNOSIS — Z01419 Encounter for gynecological examination (general) (routine) without abnormal findings: Secondary | ICD-10-CM | POA: Diagnosis not present

## 2023-08-17 DIAGNOSIS — Z139 Encounter for screening, unspecified: Secondary | ICD-10-CM | POA: Diagnosis not present

## 2023-08-17 DIAGNOSIS — Z6824 Body mass index (BMI) 24.0-24.9, adult: Secondary | ICD-10-CM | POA: Diagnosis not present

## 2023-08-17 DIAGNOSIS — Z1231 Encounter for screening mammogram for malignant neoplasm of breast: Secondary | ICD-10-CM | POA: Diagnosis not present

## 2023-08-17 DIAGNOSIS — E559 Vitamin D deficiency, unspecified: Secondary | ICD-10-CM | POA: Diagnosis not present

## 2023-09-01 DIAGNOSIS — H10021 Other mucopurulent conjunctivitis, right eye: Secondary | ICD-10-CM | POA: Diagnosis not present

## 2023-10-04 ENCOUNTER — Encounter: Payer: Self-pay | Admitting: Internal Medicine

## 2023-10-04 ENCOUNTER — Ambulatory Visit: Payer: Self-pay | Admitting: Internal Medicine

## 2023-10-04 ENCOUNTER — Ambulatory Visit: Payer: BC Managed Care – PPO | Admitting: Internal Medicine

## 2023-10-04 VITALS — BP 110/80 | HR 82 | Temp 99.0°F | Ht 68.25 in | Wt 161.0 lb

## 2023-10-04 DIAGNOSIS — H6123 Impacted cerumen, bilateral: Secondary | ICD-10-CM

## 2023-10-04 DIAGNOSIS — H9193 Unspecified hearing loss, bilateral: Secondary | ICD-10-CM

## 2023-10-04 NOTE — Telephone Encounter (Signed)
  Chief Complaint: Ear pain/clogging Symptoms: Ear pressure, discomfort Frequency: Ongoing/worsening x2 days Pertinent Negatives: Patient denies congestion, cough Disposition: [] ED /[] Urgent Care (no appt availability in office) / [x] Appointment(In office/virtual)/ []  [facility]/ [] Home Care/ [] Refused Recommended Disposition /[] [facility]/ []  Follow-up with PCP Additional Notes: Pt reports she has had issues with her inner ear her whole life and had tubes placed as an adult but has noticed in the last 48 hours her left ear has become increasingly feeling clogged, pressure and discomfort of 6/10 when lying on her left side. Pt reports she has tried OTC ear washing kits with no relief. Pt denies fever, congestion. Pt states it feels like her ear is sticky and feels full. OV scheduled today. This RN educated pt on new-worsening symptoms, when to call back/seek emergent care. Pt verbalized understanding and agrees to plan.   Copied from CRM [id number]. Topic: Clinical - Medical Advice >> Oct 04, 2023  8:50 AM Antonio DEL wrote: Reason for CRM: Patient thinks she has a clogged ear, starting to become painful and numb, like an earache. Has been feeling like there's been a build up of wax for about a month but just ignored it but started to become painful today, a 7 on the scale 1 to 10. Wants like to speak with someone and see if she should schedule appointment with Dr. Perri or go to urgent care Reason for Disposition  Ear congestion present > 48 hours  Answer Assessment - Initial Assessment Questions 1. LOCATION: Which ear is involved?       Both, left is worse 2. SENSATION: Describe how the ear feels. (e.g. stuffy, full, plugged).      Feels clogged, 'feels like something is sticking together 3. ONSET:  When did the ear symptoms start?       Waxy build up for several months, last 48 hours has become worse, clogged, numb 4. PAIN: Do you also have an earache? If  Yes, ask: How bad is it? (Scale 1-10; or mild, moderate, severe)     6/10, when lying on left side it feels worse 5. CAUSE: What do you think is causing the ear congestion?     Waxy build up 6. URI: Do you have a runny nose or cough?      None 7. NASAL ALLERGIES: Are there symptoms of hay fever, such as sneezing or a clear nasal discharge?     None  Protocols used: Ear - Congestion-A-AH

## 2023-10-04 NOTE — Progress Notes (Signed)
 Patient Care Team: Perri Ronal PARAS, MD as PCP - General (Internal Medicine) Lonni Slain, MD as PCP - Cardiology (Cardiology)  Visit Date: 10/04/23  Subjective:   Chief Complaint  Patient presents with   Ear Pain   Patient PI:Dawn Mcmillan, Dawn Mcmillan DOB:08-31-1976,47 y.o. FMW:[id number]   48 y.o. Female presents today for acute visit with Bilateral Otalgia. She reports that she is having difficulty hearing and occasional sensation of ear congestion. Notes that she's always had issues with pressurizing and has had tubes placed in her ears previously. She did try Sudafed with some relief. Did have referral for ENT in 2020, which she didn't present for.   Past Medical History:  Diagnosis Date   Allergy    AMA (advanced maternal age) multigravida 35+    Anemia    Depression    GERD (gastroesophageal reflux disease)    during pregnancy   H/O varicella    Hyperlipemia    Irritable bowel syndrome    No pertinent past medical history     Family History  Problem Relation Age of Onset   Arthritis Mother    Thrombophlebitis Mother    Hypothyroidism Mother    Colon polyps Mother    Heart disease Father    Hypertension Father    Colon polyps Brother    Thrombophlebitis Maternal Grandmother    Diabetes Maternal Grandmother    Colon polyps Maternal Grandfather    Colonic polyp Maternal Grandfather    Diabetes Paternal Grandmother    Breast cancer Paternal Grandmother    Heart disease Paternal Grandfather        blocked arteries   Colon cancer Neg Hx    Esophageal cancer Neg Hx    Pancreatic cancer Neg Hx    Rectal cancer Neg Hx    Stomach cancer Neg Hx    Social History   Social History Narrative   Not on file   Review of Systems  Constitutional:  Negative for fever and malaise/fatigue.  HENT:  Positive for congestion (ear), ear pain (bilateral) and hearing loss (some, bilateral, when wax sticks together).   Eyes:  Negative for blurred vision.  Respiratory:   Negative for cough and shortness of breath.   Cardiovascular:  Negative for chest pain, palpitations and leg swelling.  Gastrointestinal:  Negative for vomiting.  Musculoskeletal:  Negative for back pain.  Skin:  Negative for rash.  Neurological:  Negative for loss of consciousness and headaches.     Objective:  Vitals: Ht 5' 8.25 (1.734 m)   BMI 24.30 kg/m   Physical Exam Vitals and nursing note reviewed.  Constitutional:      General: She is not in acute distress.    Appearance: Normal appearance. She is not toxic-appearing.  HENT:     Head: Normocephalic and atraumatic.     Right Ear: There is impacted cerumen.     Left Ear: There is impacted cerumen.  Pulmonary:     Effort: Pulmonary effort is normal.  Skin:    General: Skin is warm and dry.  Neurological:     Mental Status: She is alert and oriented to person, place, and time. Mental status is at baseline.  Psychiatric:        Mood and Affect: Mood normal.        Behavior: Behavior normal.        Thought Content: Thought content normal.        Judgment: Judgment normal.     Results:  Studies Obtained And Personally  Reviewed By Me: Labs:     Component Value Date/Time   NA 137 07/08/2023 1157   K 4.4 07/08/2023 1157   CL 102 07/08/2023 1157   CO2 25 07/08/2023 1157   GLUCOSE 88 07/08/2023 1157   BUN 12 07/08/2023 1157   CREATININE 0.96 07/08/2023 1157   CALCIUM 9.4 07/08/2023 1157   PROT 6.8 07/08/2023 1157   ALBUMIN 4.1 10/06/2016 1131   AST 16 07/08/2023 1157   ALT 14 07/08/2023 1157   ALKPHOS 38 10/06/2016 1131   BILITOT 0.5 07/08/2023 1157   GFRNONAA 63 03/27/2021 0939   GFRAA 73 03/27/2021 0939    Lab Results  Component Value Date   WBC 9.4 07/08/2023   HGB 14.2 07/08/2023   HCT 42.3 07/08/2023   MCV 95.1 07/08/2023   PLT 286 07/08/2023   Lab Results  Component Value Date   CHOL 221 (H) 07/08/2023   HDL 77 07/08/2023   LDLCALC 125 (H) 07/08/2023   TRIG 93 07/08/2023   CHOLHDL 2.9  07/08/2023   Lab Results  Component Value Date   TSH 1.04 07/08/2023   Assessment & Plan:   Excessive Cerumen Impaction of Bilateral External Canals: causing pain. Recommended Oto-Lavage - could not perform this in-office, will contact patient with location of a [facility] facility that will provide an Oto-Lavage.    Time spent with patient and also time spent in arranging for ear lavage at another location is 20 minutes.  I,Dawn Mcmillan,acting as a neurosurgeon for Ronal JINNY Hailstone, MD.,have documented all relevant documentation on the behalf of Ronal JINNY Hailstone, MD,as directed by  Ronal JINNY Hailstone, MD while in the presence of Ronal JINNY Hailstone, MD.   I, Ronal JINNY Hailstone, MD, have reviewed all documentation for this visit. The documentation on 10/17/23 for the exam, diagnosis, procedures, and orders are all accurate and complete.

## 2023-10-05 NOTE — Progress Notes (Signed)
      Established patient visit   Patient: Dawn Mcmillan   DOB: 11/07/1975   48 y.o. Female  MRN: [id number] Visit Date: 10/06/2023  Today's healthcare provider: Trenton Frock, PA-C   Cc. Bilateral ear discomfort/clogged feeling  Subjective    Pt reports her ears have felt uncomfortable/clogged recently, she saw her PCP Dr Liane Redman earlier this week who referred her ear for cerumen impaction.   Medications: Outpatient Medications Prior to Visit  Medication Sig   ibuprofen  (ADVIL ) 600 MG tablet Take 1 tablet (600 mg total) by mouth every 6 (six) hours as needed.   Multiple Vitamin (MULTIVITAMIN) capsule Take 1 capsule by mouth daily.   No facility-administered medications prior to visit.    Review of Systems  Constitutional:  Negative for fatigue and fever.  HENT:  Positive for ear pain.   Respiratory:  Negative for cough and shortness of breath.   Cardiovascular:  Negative for chest pain and leg swelling.  Gastrointestinal:  Negative for abdominal pain.  Neurological:  Negative for dizziness and headaches.       Objective    BP 119/79   Pulse 70   Temp 98.1 F (36.7 C) (Oral)   Ht 5' 8.25" (1.734 m)   Wt 159 lb 2 oz (72.2 kg)   LMP 09/27/2023   SpO2 98%   BMI 24.02 kg/m    Physical Exam Vitals reviewed.  Constitutional:      Appearance: She is not ill-appearing.  HENT:     Head: Normocephalic.     Ears:     Comments: B/l cerumen impaction Post irrigation, right ear clear, normal TM Left ear still impacted Eyes:     Conjunctiva/sclera: Conjunctivae normal.  Cardiovascular:     Rate and Rhythm: Normal rate.  Pulmonary:     Effort: Pulmonary effort is normal. No respiratory distress.  Neurological:     General: No focal deficit present.     Mental Status: She is alert and oriented to person, place, and time.  Psychiatric:        Mood and Affect: Mood normal.        Behavior: Behavior normal.      No results found for any visits on 10/06/23.   Assessment & Plan    Excessive cerumen in ear canal, bilateral  Immunization due -     Tdap vaccine greater than or equal to 7yo IM    B/l ears flushed by MA  Right ear now clear, TM normal. Left ear still with a significant cerumen impaction Recommending otc debrox, flushes w/ debrox kit-- 1/2 warm water and 1/2 hydrogen peroxide If still no relief, recommend f/b with repeat irrigation  No follow-ups on file.       Trenton Frock, PA-C  [facility] [phone] (phone) [phone] (fax)  [facility]

## 2023-10-06 ENCOUNTER — Ambulatory Visit: Payer: BC Managed Care – PPO | Admitting: Physician Assistant

## 2023-10-06 ENCOUNTER — Encounter: Payer: Self-pay | Admitting: Physician Assistant

## 2023-10-06 VITALS — BP 119/79 | HR 70 | Temp 98.1°F | Ht 68.25 in | Wt 159.1 lb

## 2023-10-06 DIAGNOSIS — H6123 Impacted cerumen, bilateral: Secondary | ICD-10-CM | POA: Diagnosis not present

## 2023-10-06 DIAGNOSIS — Z23 Encounter for immunization: Secondary | ICD-10-CM | POA: Diagnosis not present

## 2023-10-17 ENCOUNTER — Encounter: Payer: Self-pay | Admitting: Internal Medicine

## 2023-10-17 NOTE — Patient Instructions (Signed)
Arranging for ear lavage in the near future. This is not a service we perform here in the office.

## 2023-11-09 DIAGNOSIS — R509 Fever, unspecified: Secondary | ICD-10-CM | POA: Diagnosis not present

## 2023-11-09 DIAGNOSIS — J101 Influenza due to other identified influenza virus with other respiratory manifestations: Secondary | ICD-10-CM | POA: Diagnosis not present

## 2023-11-09 DIAGNOSIS — Z20822 Contact with and (suspected) exposure to covid-19: Secondary | ICD-10-CM | POA: Diagnosis not present

## 2023-11-09 DIAGNOSIS — R051 Acute cough: Secondary | ICD-10-CM | POA: Diagnosis not present

## 2023-11-10 DIAGNOSIS — J111 Influenza due to unidentified influenza virus with other respiratory manifestations: Secondary | ICD-10-CM | POA: Diagnosis not present

## 2023-12-08 DIAGNOSIS — E559 Vitamin D deficiency, unspecified: Secondary | ICD-10-CM | POA: Diagnosis not present

## 2023-12-14 DIAGNOSIS — D225 Melanocytic nevi of trunk: Secondary | ICD-10-CM | POA: Diagnosis not present

## 2023-12-14 DIAGNOSIS — D2262 Melanocytic nevi of left upper limb, including shoulder: Secondary | ICD-10-CM | POA: Diagnosis not present

## 2023-12-14 DIAGNOSIS — D1721 Benign lipomatous neoplasm of skin and subcutaneous tissue of right arm: Secondary | ICD-10-CM | POA: Diagnosis not present

## 2023-12-14 DIAGNOSIS — D2261 Melanocytic nevi of right upper limb, including shoulder: Secondary | ICD-10-CM | POA: Diagnosis not present

## 2024-03-29 DIAGNOSIS — E559 Vitamin D deficiency, unspecified: Secondary | ICD-10-CM | POA: Diagnosis not present

## 2024-08-21 ENCOUNTER — Telehealth: Payer: Self-pay

## 2024-08-21 NOTE — Telephone Encounter (Signed)
 Please book her a lab appointment for CPE.   Copied from CRM #[id number]. Topic: Clinical - Request for Lab/Test Order >> Aug 21, 2024  1:39 PM Mesmerise C wrote: Reason for CRM: Patient has a a physical on 12/16 needs lab orders for fasting labs prior

## 2024-08-22 DIAGNOSIS — Z6824 Body mass index (BMI) 24.0-24.9, adult: Secondary | ICD-10-CM | POA: Diagnosis not present

## 2024-08-22 DIAGNOSIS — Z124 Encounter for screening for malignant neoplasm of cervix: Secondary | ICD-10-CM | POA: Diagnosis not present

## 2024-08-22 DIAGNOSIS — Z1151 Encounter for screening for human papillomavirus (HPV): Secondary | ICD-10-CM | POA: Diagnosis not present

## 2024-08-22 DIAGNOSIS — Z01419 Encounter for gynecological examination (general) (routine) without abnormal findings: Secondary | ICD-10-CM | POA: Diagnosis not present

## 2024-08-23 LAB — COMPREHENSIVE METABOLIC PANEL WITH GFR: EGFR: 60

## 2024-09-05 ENCOUNTER — Encounter: Admitting: Internal Medicine

## 2024-09-28 ENCOUNTER — Ambulatory Visit (INDEPENDENT_AMBULATORY_CARE_PROVIDER_SITE_OTHER)

## 2024-09-28 VITALS — BP 110/80 | HR 76 | Ht 69.25 in | Wt 159.0 lb

## 2024-09-28 DIAGNOSIS — Z23 Encounter for immunization: Secondary | ICD-10-CM

## 2024-09-28 NOTE — Progress Notes (Signed)
 Patient presents to the clinic for a flu vaccine. Patient received a flu vaccine left deltoid, patient tolerated well.

## 2024-10-10 NOTE — Progress Notes (Signed)
 "  Annual Comprehensive Physical Exam   Patient Care Team: Jai Bear, Ronal PARAS, MD as PCP - General (Internal Medicine) Lonni Slain, MD as PCP - Cardiology (Cardiology)  Visit Date: 10/24/24   Chief Complaint  Patient presents with   Annual Exam   Subjective:  Patient: Dawn Mcmillan, Female DOB: [birth year], 49 y.o. MRN: [id number] Vitals:   10/24/24 1051  BP: 120/80   Dawn Mcmillan is a 49 y.o. Female who presents today for her Annual Comprehensive Physical Exam. Patient has Irritable bowel syndrome; Vitamin D  deficiency; Anxiety; Dizziness; Dysfunction of both eustachian tubes; Family history of colorectal cancer; Palpitations; and Elevated LDL cholesterol level on their problem list.  History of hyperlipidemia currently diet and exercise controlled. 10/20/2024 Lipid Panel CHOL 239, LDL 144, Otherwise WNL.     Seen in [facility] ED on 04/09/23 for right ankle injury received during a soccer game when kicking the ball. 04/09/23 right ankle X-ray showed no displaced fracture or dislocation, first MTP degenerative changes, no acute osseous abnormalities of the foot. Discharged with Roxicodone .   No history of serious illnesses, accidents or operations.    GYN exam and mammogram done at GYN in December.    Labs 10/20/2024  Creatinine 1.01, CHOL 239, LDL 144, Otherwise WNL.     07/21/2023 Mammogram no mammographic evidence of malignancy. Repeat in on year.      10/20/2022 Coronary calcium  score: 0  07/09/2021 Colonoscopy The examined portion of the ileum was normal. Two diminutive polyps in the cecum, removed with a cold snare. Resected and retrieved. Pathology found to be precancerous. Internal hemorrhoids. The examination was otherwise normal. Repeat in 7 years.   Health Maintenance  Topic Date Due   Mammogram  07/29/2023   COVID-19 Vaccine (2 - 2025-26 season) 11/09/2024 (Originally 05/22/2024)   Hepatitis B Vaccines 19-59 Average Risk (1 of 3 - 19+ 3-dose series)  10/24/2025 (Originally 11/17/1995)   Cervical Cancer Screening (HPV/Pap Cotest)  07/08/2026   Colonoscopy  07/09/2028   DTaP/Tdap/Td (4 - Td or Tdap) 10/05/2033   Influenza Vaccine  Completed   HPV VACCINES (No Doses Required) Completed   HIV Screening  Completed   Pneumococcal Vaccine  Aged Out   Meningococcal B Vaccine  Aged Out   Hepatitis C Screening  Discontinued    Review of Systems  Constitutional:  Negative for fever and malaise/fatigue.  HENT:  Negative for congestion.   Eyes:  Negative for blurred vision.  Respiratory:  Negative for cough and shortness of breath.   Cardiovascular:  Negative for chest pain, palpitations and leg swelling.  Gastrointestinal:  Negative for vomiting.  Musculoskeletal:  Negative for back pain.  Skin:  Negative for rash.  Neurological:  Negative for loss of consciousness and headaches.   Objective:  Vitals: body mass index is 23.7 kg/m. Today's Vitals   10/24/24 1051  BP: 120/80  Pulse: 70  SpO2: 97%  Weight: 157 lb (71.2 kg)  Height: 5' 8.25 (1.734 m)   Physical Exam Vitals and nursing note reviewed.  Constitutional:      General: She is not in acute distress.    Appearance: Normal appearance. She is not ill-appearing or toxic-appearing.  HENT:     Head: Normocephalic and atraumatic.     Right Ear: Hearing, tympanic membrane, ear canal and external ear normal.     Left Ear: Hearing, tympanic membrane, ear canal and external ear normal.     Mouth/Throat:     Pharynx: Oropharynx is clear.  Eyes:  Extraocular Movements: Extraocular movements intact.     Pupils: Pupils are equal, round, and reactive to light.  Neck:     Thyroid: No thyroid mass, thyromegaly or thyroid tenderness.     Vascular: No carotid bruit.  Cardiovascular:     Rate and Rhythm: Normal rate and regular rhythm. No extrasystoles are present.    Pulses:          Dorsalis pedis pulses are 2+ on the right side and 2+ on the left side.     Heart sounds: Normal  heart sounds. No murmur heard.    No friction rub. No gallop.  Pulmonary:     Effort: Pulmonary effort is normal.     Breath sounds: Normal breath sounds. No decreased breath sounds, wheezing, rhonchi or rales.  Chest:     Chest wall: No mass.  Abdominal:     Palpations: Abdomen is soft. There is no hepatomegaly, splenomegaly or mass.     Tenderness: There is no abdominal tenderness.     Hernia: No hernia is present.  Genitourinary:    Comments: GYN deferred.  Musculoskeletal:     Cervical back: Normal range of motion.     Right lower leg: No edema.     Left lower leg: No edema.  Lymphadenopathy:     Cervical: No cervical adenopathy.     Upper Body:     Right upper body: No supraclavicular adenopathy.     Left upper body: No supraclavicular adenopathy.  Skin:    General: Skin is warm and dry.  Neurological:     General: No focal deficit present.     Mental Status: She is alert and oriented to person, place, and time. Mental status is at baseline.     Sensory: Sensation is intact.     Motor: Motor function is intact. No weakness.     Deep Tendon Reflexes: Reflexes are normal and symmetric.  Psychiatric:        Attention and Perception: Attention normal.        Mood and Affect: Mood normal.        Speech: Speech normal.        Behavior: Behavior normal.        Thought Content: Thought content normal.        Cognition and Memory: Cognition normal.        Judgment: Judgment normal.     Current Outpatient Medications  Medication Instructions   cholecalciferol (VITAMIN D3) 1,000 Units, Daily   ibuprofen  (ADVIL ) 600 mg, Oral, Every 6 hours PRN   Multiple Vitamin (MULTIVITAMIN) capsule 1 capsule, Daily   NON FORMULARY 4.5 g, 3 times daily with meals   rosuvastatin  (CRESTOR ) 5 mg, Oral, Daily   Past Medical History:  Diagnosis Date   Allergy    AMA (advanced maternal age) multigravida 35+    Anemia    Depression    GERD (gastroesophageal reflux disease)    during  pregnancy   H/O varicella    Hyperlipemia    Irritable bowel syndrome    No pertinent past medical history    Medical/Surgical History Narrative:  Allergic/Intolerant to: Allergies[1]  Past Surgical History:  Procedure Laterality Date   COLONOSCOPY     DILATION AND CURETTAGE OF UTERUS     x 2   PILONIDAL CYST EXCISION  09/21/2001   POLYPECTOMY     WISDOM TOOTH EXTRACTION     Family History  Problem Relation Age of Onset   Arthritis Mother  Thrombophlebitis Mother    Hypothyroidism Mother    Colon polyps Mother    Heart disease Father    Hypertension Father    Colon polyps Brother    Thrombophlebitis Maternal Grandmother    Diabetes Maternal Grandmother    Colon polyps Maternal Grandfather    Colonic polyp Maternal Grandfather    Diabetes Paternal Grandmother    Breast cancer Paternal Grandmother    Heart disease Paternal Grandfather        blocked arteries   Colon cancer Neg Hx    Esophageal cancer Neg Hx    Pancreatic cancer Neg Hx    Rectal cancer Neg Hx    Stomach cancer Neg Hx     Social Hx: Married with 2 children.  Works full-time and also owns and  operates an forensic scientist.  Non-smoker. Works as a teaching laboratory technician. Exercises regularly.   Most Recent Health Risks Assessment:   Most Recent Social Determinants of Health (Including Hx of Tobacco, Alcohol, and Drug Use) SDOH Screenings   Food Insecurity: No Food Insecurity (07/16/2023)  Housing: Low Risk (07/16/2023)  Transportation Needs: No Transportation Needs (07/16/2023)  Utilities: Not At Risk (07/16/2023)  Depression (PHQ2-9): Low Risk (10/24/2024)  Tobacco Use: Medium Risk (10/24/2024)  Health Literacy: Adequate Health Literacy (07/16/2023)   Social Hx: Married with 2 children. Exercises regularly. Works full time with a scientist, physiological   Most Recent Fall Risk Assessment:    07/16/2023    2:09 PM  Fall Risk   Falls in the past year? 0  Number falls in past yr: 0  Injury  with Fall? 0   Risk for fall due to : No Fall Risks  Follow up Falls evaluation completed     Data saved with a previous flowsheet row definition   Most Recent Anxiety/Depression Screenings:    10/24/2024   10:59 AM 07/16/2023    2:09 PM  PHQ 2/9 Scores  PHQ - 2 Score 0 0  PHQ- 9 Score 4       10/24/2024   10:59 AM  GAD 7 : Generalized Anxiety Score  Nervous, Anxious, on Edge 1  Control/stop worrying 1  Worry too much - different things 1  Trouble relaxing 1  Restless 1  Easily annoyed or irritable 1  Afraid - awful might happen 1  Total GAD 7 Score 7  Anxiety Difficulty Not difficult at all   Results:  Studies Obtained And Personally Reviewed By Me:   07/21/2023 Mammogram no mammographic evidence of malignancy. Repeat in on year.      10/20/2022 Coronary calcium  score: 0  07/09/2021 Colonoscopy The examined portion of the ileum was normal. Two diminutive polyps in the cecum, removed with a cold snare. Resected and retrieved. Pathology found to be precancerous. Internal hemorrhoids. The examination was otherwise normal. Repeat in 7 years.   Labs:  CBC w/ Differential Lab Results  Component Value Date   WBC 8.8 10/20/2024   RBC 4.66 10/20/2024   HGB 14.6 10/20/2024   HCT 43.9 10/20/2024   PLT 286 10/20/2024   MCV 94.2 10/20/2024   MCH 31.3 10/20/2024   MCHC 33.3 10/20/2024   RDW 12.2 10/20/2024   MPV 10.1 10/20/2024   LYMPHSABS 1,927 06/23/2022   MONOABS 335 10/06/2016   BASOSABS 26 10/20/2024    Comprehensive Metabolic Panel Lab Results  Component Value Date   NA 138 10/20/2024   K 4.3 10/20/2024   CL 103 10/20/2024   CO2 27 10/20/2024   GLUCOSE 92  10/20/2024   BUN 13 10/20/2024   CREATININE 1.01 (H) 10/20/2024   CALCIUM  9.6 10/20/2024   PROT 7.1 10/20/2024   ALBUMIN 4.1 10/06/2016   AST 17 10/20/2024   ALT 18 10/20/2024   ALKPHOS 38 10/06/2016   BILITOT 0.5 10/20/2024   EGFR 69 10/20/2024   GFRNONAA 63 03/27/2021   Lipid Panel  Lab Results   Component Value Date   CHOL 239 (H) 10/20/2024   HDL 72 10/20/2024   LDLCALC 144 (H) 10/20/2024   TRIG 114 10/20/2024   TSH Lab Results  Component Value Date   TSH 1.12 10/20/2024   Assessment & Plan:   Orders Placed This Encounter  Procedures   POCT URINALYSIS DIP (CLINITEK)   Meds ordered this encounter  Medications   rosuvastatin  (CRESTOR ) 5 MG tablet    Sig: Take 1 tablet (5 mg total) by mouth daily.    Dispense:  90 tablet    Refill:  3   Hyperlipidemia: currently diet and exercise controlled. 10/20/2024 Lipid Panel CHOL 239, LDL 144, HDL 72 , Triglycerides 114. Excellent coronary calcium  score of 0 in 2024. However, LDL has increased from 125 to 144. Suggest Crestor  5 mg daily and follow up in about 3 months or so with fasting lipid panel and liver functions.     No history of serious illnesses, accidents or operations.    GYN exam and mammogram done at GYN in December.  Request copy of mammogram from GYN office.    10/20/2022 Coronary calcium  score: 0  07/09/2021 Colonoscopy The examined portion of the ileum was normal. Two diminutive polyps in the cecum, removed with a cold snare. Resected and retrieved. Pathology showed one polyp to be adenomatous. Internal hemorrhoids were present. The examination was otherwise normal. Repeat recommended in 7 years.   Plan: Follow up lipid panel and liver functions in about 3 months.on generic Crestor      Annual Comprehensive Physical Exam done today including the all of the following: Reviewed patient's Family Medical History Reviewed patient's SDOH and reviewed tobacco, alcohol, and drug use.  Reviewed and updated list of patient's medical providers Assessment of cognitive impairment was done Assessed patient's functional ability Established a written schedule for health screening services Health Risk Assessent Completed and Reviewed  Discussed health benefits of physical activity, and encouraged her to engage in regular  exercise appropriate for her age and condition.    I,Makayla C Reid,acting as a scribe for Ronal JINNY Hailstone, MD.,have documented all relevant documentation on the behalf of Ronal JINNY Hailstone, MD,as directed by  Ronal JINNY Hailstone, MD while in the presence of Ronal JINNY Hailstone, MD.  I, Ronal JINNY Hailstone, MD, have reviewed all documentation for and agree with the above Annual Wellness Visit documentation.  Ronal JINNY Hailstone, MD Internal Medicine 10/24/2024     [1] No Known Allergies  "

## 2024-10-20 ENCOUNTER — Other Ambulatory Visit

## 2024-10-20 DIAGNOSIS — E7800 Pure hypercholesterolemia, unspecified: Secondary | ICD-10-CM

## 2024-10-20 DIAGNOSIS — N951 Menopausal and female climacteric states: Secondary | ICD-10-CM

## 2024-10-20 DIAGNOSIS — Z0000 Encounter for general adult medical examination without abnormal findings: Secondary | ICD-10-CM

## 2024-10-20 DIAGNOSIS — Z1329 Encounter for screening for other suspected endocrine disorder: Secondary | ICD-10-CM

## 2024-10-20 NOTE — Addendum Note (Signed)
 Addended by: BEVELY CURTISTINE PARAS on: 10/20/2024 01:01 PM   Modules accepted: Orders

## 2024-10-21 ENCOUNTER — Ambulatory Visit: Payer: Self-pay | Admitting: Internal Medicine

## 2024-10-21 LAB — CBC WITH DIFFERENTIAL/PLATELET
Absolute Lymphocytes: 1804 {cells}/uL (ref 850–3900)
Absolute Monocytes: 370 {cells}/uL (ref 200–950)
Basophils Absolute: 26 {cells}/uL (ref 0–200)
Basophils Relative: 0.3 %
Eosinophils Absolute: 26 {cells}/uL (ref 15–500)
Eosinophils Relative: 0.3 %
HCT: 43.9 % (ref 35.9–46.0)
Hemoglobin: 14.6 g/dL (ref 11.7–15.5)
MCH: 31.3 pg (ref 27.0–33.0)
MCHC: 33.3 g/dL (ref 31.6–35.4)
MCV: 94.2 fL (ref 81.4–101.7)
MPV: 10.1 fL (ref 7.5–12.5)
Monocytes Relative: 4.2 %
Neutro Abs: 6574 {cells}/uL (ref 1500–7800)
Neutrophils Relative %: 74.7 %
Platelets: 286 10*3/uL (ref 140–400)
RBC: 4.66 Million/uL (ref 3.80–5.10)
RDW: 12.2 % (ref 11.0–15.0)
Total Lymphocyte: 20.5 %
WBC: 8.8 10*3/uL (ref 3.8–10.8)

## 2024-10-21 LAB — COMPREHENSIVE METABOLIC PANEL WITH GFR
AG Ratio: 1.8 (calc) (ref 1.0–2.5)
ALT: 18 U/L (ref 6–29)
AST: 17 U/L (ref 10–35)
Albumin: 4.6 g/dL (ref 3.6–5.1)
Alkaline phosphatase (APISO): 48 U/L (ref 31–125)
BUN/Creatinine Ratio: 13 (calc) (ref 6–22)
BUN: 13 mg/dL (ref 7–25)
CO2: 27 mmol/L (ref 20–32)
Calcium: 9.6 mg/dL (ref 8.6–10.2)
Chloride: 103 mmol/L (ref 98–110)
Creat: 1.01 mg/dL — ABNORMAL HIGH (ref 0.50–0.99)
Globulin: 2.5 g/dL (ref 1.9–3.7)
Glucose, Bld: 92 mg/dL (ref 65–99)
Potassium: 4.3 mmol/L (ref 3.5–5.3)
Sodium: 138 mmol/L (ref 135–146)
Total Bilirubin: 0.5 mg/dL (ref 0.2–1.2)
Total Protein: 7.1 g/dL (ref 6.1–8.1)
eGFR: 69 mL/min/{1.73_m2}

## 2024-10-21 LAB — LIPID PANEL
Cholesterol: 239 mg/dL — ABNORMAL HIGH
HDL: 72 mg/dL
LDL Cholesterol (Calc): 144 mg/dL — ABNORMAL HIGH
Non-HDL Cholesterol (Calc): 167 mg/dL — ABNORMAL HIGH
Total CHOL/HDL Ratio: 3.3 (calc)
Triglycerides: 114 mg/dL

## 2024-10-21 LAB — C-REACTIVE PROTEIN: CRP: <3 mg/L

## 2024-10-21 LAB — TSH: TSH: 1.12 m[IU]/L

## 2024-10-24 ENCOUNTER — Encounter: Payer: Self-pay | Admitting: Internal Medicine

## 2024-10-24 ENCOUNTER — Ambulatory Visit: Admitting: Internal Medicine

## 2024-10-24 VITALS — BP 120/80 | HR 70 | Ht 68.25 in | Wt 157.0 lb

## 2024-10-24 DIAGNOSIS — Z0000 Encounter for general adult medical examination without abnormal findings: Secondary | ICD-10-CM

## 2024-10-24 DIAGNOSIS — Z0001 Encounter for general adult medical examination with abnormal findings: Secondary | ICD-10-CM

## 2024-10-24 DIAGNOSIS — Z860101 Personal history of adenomatous and serrated colon polyps: Secondary | ICD-10-CM

## 2024-10-24 DIAGNOSIS — E7800 Pure hypercholesterolemia, unspecified: Secondary | ICD-10-CM | POA: Diagnosis not present

## 2024-10-24 MED ORDER — ROSUVASTATIN CALCIUM 5 MG PO TABS: 5.0000 mg | ORAL_TABLET | Freq: Every day | ORAL | 3 refills | Status: AC

## 2024-10-24 NOTE — Patient Instructions (Addendum)
 It was a pleasure to see you today. Please take Crestor  5 mg daily and follow up with fasting lipid panel and liver functions in 3 months. Colonoscopy is up to date.
